# Patient Record
Sex: Female | Born: 1975 | Race: White | Hispanic: No | Marital: Married | Smoking: Never smoker
Health system: Southern US, Community
[De-identification: ages and names within clinical notes are randomized; demographics above are authoritative.]

## PROBLEM LIST (undated history)

## (undated) DIAGNOSIS — G43829 Menstrual migraine, not intractable, without status migrainosus: Secondary | ICD-10-CM

## (undated) DIAGNOSIS — O24419 Gestational diabetes mellitus in pregnancy, unspecified control: Secondary | ICD-10-CM

## (undated) DIAGNOSIS — Z87898 Personal history of other specified conditions: Secondary | ICD-10-CM

## (undated) HISTORY — DX: Personal history of other specified conditions: Z87.898

## (undated) HISTORY — PX: WISDOM TOOTH EXTRACTION: SHX21

## (undated) HISTORY — DX: Gestational diabetes mellitus in pregnancy, unspecified control: O24.419

## (undated) HISTORY — DX: Menstrual migraine, not intractable, without status migrainosus: G43.829

---

## 2001-06-15 ENCOUNTER — Other Ambulatory Visit: Admission: RE | Admit: 2001-06-15 | Discharge: 2001-06-15 | Payer: Self-pay | Admitting: Obstetrics and Gynecology

## 2002-07-16 ENCOUNTER — Other Ambulatory Visit: Admission: RE | Admit: 2002-07-16 | Discharge: 2002-07-16 | Payer: Self-pay | Admitting: Obstetrics and Gynecology

## 2003-12-29 ENCOUNTER — Inpatient Hospital Stay (HOSPITAL_COMMUNITY): Admission: AD | Admit: 2003-12-29 | Discharge: 2003-12-31 | Payer: Self-pay | Admitting: Obstetrics and Gynecology

## 2005-07-25 ENCOUNTER — Other Ambulatory Visit: Admission: RE | Admit: 2005-07-25 | Discharge: 2005-07-25 | Payer: Self-pay | Admitting: Obstetrics and Gynecology

## 2006-02-04 ENCOUNTER — Inpatient Hospital Stay (HOSPITAL_COMMUNITY): Admission: AD | Admit: 2006-02-04 | Discharge: 2006-02-06 | Payer: Self-pay | Admitting: Obstetrics and Gynecology

## 2008-01-05 ENCOUNTER — Inpatient Hospital Stay (HOSPITAL_COMMUNITY): Admission: AD | Admit: 2008-01-05 | Discharge: 2008-01-07 | Payer: Self-pay | Admitting: Obstetrics and Gynecology

## 2010-02-20 ENCOUNTER — Encounter: Admission: RE | Admit: 2010-02-20 | Discharge: 2010-02-20 | Payer: Self-pay | Admitting: Obstetrics and Gynecology

## 2010-04-24 ENCOUNTER — Inpatient Hospital Stay (HOSPITAL_COMMUNITY): Admission: RE | Admit: 2010-04-24 | Discharge: 2010-04-26 | Payer: Self-pay | Admitting: Obstetrics and Gynecology

## 2010-12-15 LAB — CBC
MCHC: 34.2 g/dL (ref 30.0–36.0)
MCHC: 34.9 g/dL (ref 30.0–36.0)
MCV: 87 fL (ref 78.0–100.0)
Platelets: 231 10*3/uL (ref 150–400)
Platelets: 246 10*3/uL (ref 150–400)
RDW: 13.6 % (ref 11.5–15.5)
RDW: 13.7 % (ref 11.5–15.5)
WBC: 10.5 10*3/uL (ref 4.0–10.5)
WBC: 10.8 10*3/uL — ABNORMAL HIGH (ref 4.0–10.5)

## 2010-12-15 LAB — GLUCOSE, RANDOM: Glucose, Bld: 105 mg/dL — ABNORMAL HIGH (ref 70–99)

## 2010-12-15 LAB — SYPHILIS: RPR W/REFLEX TO RPR TITER AND TREPONEMAL ANTIBODIES, TRADITIONAL SCREENING AND DIAGNOSIS ALGORITHM: RPR Ser Ql: NONREACTIVE

## 2011-02-15 NOTE — Discharge Summary (Signed)
Loretta Howe, Loretta Howe               ACCOUNT NO.:  [id number]   MEDICAL RECORD NO.:  [id number]          PATIENT TYPE:  INP   LOCATION:  9115                          FACILITY:  [facility]   PHYSICIAN:  Huel Cote, M.D. DATE OF BIRTH:  [birth year]   DATE OF ADMISSION:  02/04/2006  DATE OF DISCHARGE:  02/06/2006                                 DISCHARGE SUMMARY   DISCHARGE DIAGNOSES:  1.  Term pregnancy at 39 plus weeks, delivered.  2.  Status post normal spontaneous vaginal delivery.   DISCHARGE MEDICATIONS:  1.  Motrin 600 mg p.o. every 6 hours.  2.  Percocet 1-2 tablets p.o. every 4 hours p.r.n.   DISCHARGE FOLLOWUP:  The patient's follow up in 6 weeks for routine  postpartum exam.   HOSPITAL COURSE:  The patient is a 35 year old G2, P1-0-0-1 who was admitted  at 39-2/[redacted] weeks gestation for induction of labor given a favorable cervix  and term status.  Prenatal care was uneventful.  Prenatal labs were as  follows.  O+, antibody negative, RPR nonreactive, rubella immune, hepatitis  B surface antigen negative, HIV negative, GC negative, chlamydia negative,  group B strep negative, 1-hour Glucola 106.   PAST OB HISTORY:  In March 2005, the patient had a vaginal delivery of a  female infant.   PAST GYN HISTORY:  None.   PAST MEDICAL HISTORY:  None.   PAST SURGICAL HISTORY:  Wisdom tooth extraction in 1999.   She had no allergies and was on no medications.   She was afebrile with stable vital signs.  Fetal heart rate was reactive.  Cervix was 50, 2 and -2 station.  She had rupture of membranes performed.  Clear fluid noted after arrival.  She then progressed quickly to complete  dilation, pushed well, had normal spontaneous vaginal delivery of a vigorous  female infant over a small first-degree laceration.  Apgars were 8 and 9.  Weight was 7 pounds 3 ounces.  Placenta delivered spontaneously.  A small  laceration was repaired 2-0 Vicryl for hemostasis.  Placenta was dilated for  cord blood.  She then requested early discharge on postpartum day #1.  Was  doing very well with minimal pain.  Her lochia was normal and she was felt  stable for discharge home.     Huel Cote, M.D.  Electronically Signed    KR/MEDQ  D:  02/24/2006  T:  02/24/2006  Job:  [id number]

## 2011-02-15 NOTE — Discharge Summary (Signed)
Loretta Howe, Loretta Howe                           ACCOUNT NO.:  [id number]   MEDICAL RECORD NO.:  [id number]                   PATIENT TYPE:  INP   LOCATION:  9105                                 FACILITY:  [facility]   PHYSICIAN:  Malachi Pro. Ambrose Mantle, M.D.              DATE OF BIRTH:  08/17/1976   DATE OF ADMISSION:  12/29/2003  DATE OF DISCHARGE:  12/31/2003                                 DISCHARGE SUMMARY   HISTORY:  [age]-year-old white female para 0 gravida 1 with EDC of  December 30, 2003 by 9-week scan presented to labor and delivery for induction  of labor given the fact that she was at term and had a favorable cervix.  Prenatal care had been uncomplicated.  Blood group and type O positive,  negative antibody, negative RPR, rubella equivocal, hepatitis B surface  antigen negative, HIV declined, GC and Chlamydia negative, triple screen was  declined, 1-hour glucola was 92.   PAST OBSTETRIC HISTORY/GYNECOLOGIC HISTORY/MEDICAL HISTORY:  All negative.  Wisdom teeth had been extracted.  Had no known drug allergies.  Was on no  medication.   FAMILY HISTORY:  Negative.   HOSPITAL COURSE:  On admission her vital signs were normal, heart and lungs  were clear, abdomen was soft, cervic was 2 cm, 50%, vertex, at a -2.  Artificial rupture of the membranes produced clear fluid.  Patient later  received an epidural, by 12:30 p.m. had progressed to 8 cm dilatation, she  reached complete dilatation and pushed very well with a spontaneous delivery  of a vigorous female infant over a small second degree laceration by Dr.  Senaida Ores a living 8-pound 12-ounce infant, Apgars of 8 and 9 at one and  five minutes, nuchal cord x1 was reduced, placenta was delivered  spontaneously, a small second degree laceration repaired with 2-0 Vicryl,  cervix and rectum were intact, blood loss about 350 mL.  Postpartum the  patient did well and was discharged on the second postpartum day.  Initial  hemoglobin 12.1,  hematocrit 35.7, white count 14,800, platelet count  310,000.  Followup hemoglobin and hematocrit 10.4 and 30.8.  RPR was  nonreactive.   FINAL DIAGNOSIS:  Intrauterine pregnancy at term delivered vertex.   OPERATION:  Spontaneous vaginal delivery, repair of second degree perineal  laceration.   FINAL CONDITION:  Improved.   DISCHARGE INSTRUCTIONS:  Instructions include our regular discharge  instruction booklet.  Patient is offered the rubella vaccine, given a  prescription for Percocet 5/325 (20 tablets) one every 4-6 hours as needed  for pain, is advised to return to the office in 6 weeks for followup  examination.                                               Malachi Pro. Ambrose Mantle,  M.D.    TFH/MEDQ  D:  12/31/2003  T:  01/01/2004  Job:  [id number]

## 2011-02-15 NOTE — Discharge Summary (Signed)
NAMEHAYDIN, Loretta Howe               ACCOUNT NO.:  [id number]   MEDICAL RECORD NO.:  [id number]           PATIENT TYPE:   LOCATION:                                 FACILITY:   PHYSICIAN:  Huel Cote, M.D.      DATE OF BIRTH:   DATE OF ADMISSION:  01/05/2008  DATE OF DISCHARGE:  01/07/2008                               DISCHARGE SUMMARY   DISCHARGE DIAGNOSES:  1. Term pregnancy at 39 plus weeks delivered.  2. Status post normal spontaneous vaginal delivery.   DISCHARGE MEDICATIONS:  1. Motrin 600 mg p.o. every 6 hours.  2. Percocet 1-2 tablets p.o. every 4 hours p.r.n.   DISCHARGE FOLLOWUP:  The patient is to follow up in the office in 6  weeks for her routine postpartum exam.   HOSPITAL COURSE:  The patient is a 35 year old G3, P2-0-0-2, who was  admitted at 54 weeks' gestation with term status for induction of labor  given a favorable cervix.  Prenatal care was uneventful.  Prenatal labs  are as follows, O positive, antibody negative, RPR nonreactive, rubella  immune, hepatitis B surface antigen negative, HIV negative, GC negative,  chlamydia negative, group B strep negative, 1-hour Glucola 95, first  trimester screen normal.   PAST OBSTETRICAL HISTORY:  The patient had a vaginal delivery in March  2005, 8-pound 12-ounce female infant.  She had a second vaginal delivery  in May 2007 of 7-pound 3-ounce infant female.   PAST GYN HISTORY:  None.   PAST SURGICAL HISTORY:  Wisdom teeth removal only.   PAST MEDICAL HISTORY:  None.   ALLERGIES:  None.   MEDICATIONS:  Prenatal vitamins only.   Upon admission, she was 60% effaced, 2+ cm dilated, and -2 station.  Fetal heart rate was reactive.  She had rupture of membranes performed  and was on Pitocin.  She quickly reached for complete dilation by lunch  time and had an easy vaginal delivery of a vigorous female infant.  Weight was 7 pounds 8 ounces.  Apgars were 9 and 9.  Placenta  delivered spontaneously.  Cervix and  rectum were intact.  She had small  perineal abrasion, which was repaired for hemostasis.  She then did  quite well.  Postpartum hemoglobin was 10 and she on postpartum day #2  was doing quite well.  Her pain was well controlled with Motrin and  Percocet and she was felt stable for discharge home.      Huel Cote, M.D.  Electronically Signed     KR/MEDQ  D:  02/17/2008  T:  02/17/2008  Job:  [id number]

## 2011-06-25 LAB — CBC
Hemoglobin: 11.8 — ABNORMAL LOW
MCV: 85.4
Platelets: 224
RBC: 3.37 — ABNORMAL LOW
RBC: 4.01
RDW: 14.1
WBC: 13.6 — ABNORMAL HIGH

## 2011-06-25 LAB — RPR: RPR Ser Ql: NONREACTIVE

## 2011-10-01 NOTE — L&D Delivery Note (Signed)
Delivery Note At 3:39 PM a viable female was delivered via  (Presentation: LOA ).  APGAR: 8,9 ; weight pending..   Placenta status: delivered spontaneously.  Cord:  with the following complications: short.  Placenta sent to path for IUGR.  Anesthesia:epidural   Episiotomy: none Lacerations: none Suture Repair: n/a Est. Blood Loss (mL): 350cc  Mom to postpartum.  Baby to stay in room with mother.  Vigorous cry, wil observe closely.Marland Kitchen  Oliver Pila 06/02/2012, 3:59 PM

## 2011-11-18 LAB — OB RESULTS CONSOLE GC/CHLAMYDIA
Chlamydia: NEGATIVE
Gonorrhea: NEGATIVE

## 2011-11-18 LAB — OB RESULTS CONSOLE ABO/RH

## 2011-11-18 LAB — OB RESULTS CONSOLE RPR: RPR: NONREACTIVE

## 2011-11-18 LAB — OB RESULTS CONSOLE HIV ANTIBODY (ROUTINE TESTING): HIV: NONREACTIVE

## 2011-11-18 LAB — OB RESULTS CONSOLE RUBELLA ANTIBODY, IGM: Rubella: UNDETERMINED

## 2011-12-09 ENCOUNTER — Other Ambulatory Visit: Payer: Self-pay

## 2012-04-01 ENCOUNTER — Encounter: Payer: BC Managed Care – PPO | Attending: Obstetrics and Gynecology | Admitting: *Deleted

## 2012-04-01 DIAGNOSIS — Z713 Dietary counseling and surveillance: Secondary | ICD-10-CM | POA: Insufficient documentation

## 2012-04-01 DIAGNOSIS — O99810 Abnormal glucose complicating pregnancy: Secondary | ICD-10-CM | POA: Insufficient documentation

## 2012-04-02 ENCOUNTER — Encounter: Payer: Self-pay | Admitting: *Deleted

## 2012-04-02 NOTE — Patient Instructions (Signed)
Goals:  Check glucose levels per MD as instructed  Follow Gestational Diabetes Diet as instructed  Call for follow-up as needed    

## 2012-04-02 NOTE — Progress Notes (Signed)
  Patient was seen on 04/01/12 for Gestational Diabetes self-management class at the [facility]. The following learning objectives were met by the patient during this course:   States the definition of Gestational Diabetes  States why dietary management is important in controlling blood glucose  Describes the effects each nutrient has on blood glucose levels  Demonstrates ability to create a balanced meal plan  Demonstrates carbohydrate counting   States when to check blood glucose levels  Demonstrates proper blood glucose monitoring techniques  States the effect of stress and exercise on blood glucose levels  States the importance of limiting caffeine and abstaining from alcohol and smoking  Blood glucose monitor given: Accu Chek Nano BG Monitoring Kit Lot # [id number] X Exp: 1/14 Blood glucose reading: 85 md/dL  Patient instructed to monitor glucose levels: FBS: 60 - <90 1 hour: <140   *Patient received handouts:  Nutrition Diabetes and Pregnancy  Carbohydrate Counting List  Patient will be seen for follow-up as needed.

## 2012-05-18 ENCOUNTER — Other Ambulatory Visit (HOSPITAL_COMMUNITY): Payer: Self-pay | Admitting: Obstetrics and Gynecology

## 2012-05-18 DIAGNOSIS — O2690 Pregnancy related conditions, unspecified, unspecified trimester: Secondary | ICD-10-CM

## 2012-05-19 ENCOUNTER — Encounter (HOSPITAL_COMMUNITY): Payer: Self-pay

## 2012-05-19 ENCOUNTER — Other Ambulatory Visit (HOSPITAL_COMMUNITY): Payer: Self-pay | Admitting: Obstetrics and Gynecology

## 2012-05-19 ENCOUNTER — Ambulatory Visit (HOSPITAL_COMMUNITY)
Admission: RE | Admit: 2012-05-19 | Discharge: 2012-05-19 | Disposition: A | Discharge status: Home / Self Care | Payer: BC Managed Care – PPO | Source: Ambulatory Visit | Attending: Obstetrics and Gynecology | Admitting: Obstetrics and Gynecology

## 2012-05-19 VITALS — BP 117/69 | HR 82 | Wt 180.8 lb

## 2012-05-19 DIAGNOSIS — O365990 Maternal care for other known or suspected poor fetal growth, unspecified trimester, not applicable or unspecified: Secondary | ICD-10-CM | POA: Insufficient documentation

## 2012-05-19 DIAGNOSIS — O2690 Pregnancy related conditions, unspecified, unspecified trimester: Secondary | ICD-10-CM

## 2012-05-19 DIAGNOSIS — O99810 Abnormal glucose complicating pregnancy: Secondary | ICD-10-CM | POA: Insufficient documentation

## 2012-05-19 DIAGNOSIS — IMO0002 Reserved for concepts with insufficient information to code with codable children: Secondary | ICD-10-CM

## 2012-05-19 NOTE — Progress Notes (Signed)
Loretta Howe  was seen today for an ultrasound appointment.  See full report in [facility].  Alpha Gula, MD  Single IUP at 35 6/7 weeks Estimated fetal weight today is at the 15th %tile.  [facility] measures < 3rd %tile Elevated umbilical artery Doppler studies (93rd %tile) without evidence of absent or reversed diastolic flow Normal amniotic fluid volume  Recommend 2x weekly antepartum fetal testing - weekly BPP/ Doppler studies with MFM and weekly NSTs at referring office Recommend induction of labor at 37-[redacted] weeks gestation.

## 2012-05-26 ENCOUNTER — Ambulatory Visit (HOSPITAL_COMMUNITY)
Admission: RE | Admit: 2012-05-26 | Discharge: 2012-05-26 | Disposition: A | Discharge status: Home / Self Care | Payer: BC Managed Care – PPO | Source: Ambulatory Visit | Attending: Obstetrics and Gynecology | Admitting: Obstetrics and Gynecology

## 2012-05-26 ENCOUNTER — Encounter (HOSPITAL_COMMUNITY): Payer: Self-pay | Admitting: *Deleted

## 2012-05-26 ENCOUNTER — Telehealth (HOSPITAL_COMMUNITY): Payer: Self-pay | Admitting: *Deleted

## 2012-05-26 DIAGNOSIS — O99810 Abnormal glucose complicating pregnancy: Secondary | ICD-10-CM | POA: Insufficient documentation

## 2012-05-26 DIAGNOSIS — IMO0002 Reserved for concepts with insufficient information to code with codable children: Secondary | ICD-10-CM

## 2012-05-26 DIAGNOSIS — O365990 Maternal care for other known or suspected poor fetal growth, unspecified trimester, not applicable or unspecified: Secondary | ICD-10-CM | POA: Insufficient documentation

## 2012-05-26 LAB — OB RESULTS CONSOLE GBS: GBS: NEGATIVE

## 2012-05-26 IMAGING — US US FETAL BPP W/O NONSTRESS
1 series · 12 of 28 positions shown · non-contrast
Comparison: none

[Series 1: us fetal bpp w/o nonstress · 12 of 29 slices shown]
[im 2/29]
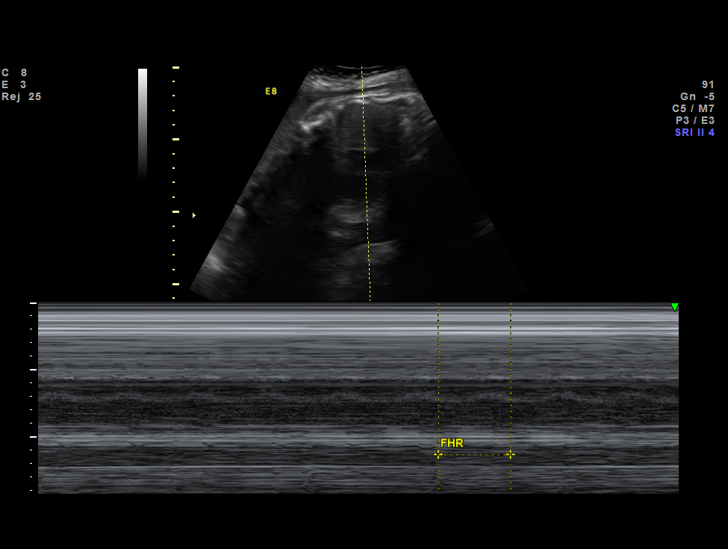
[im 4/29]
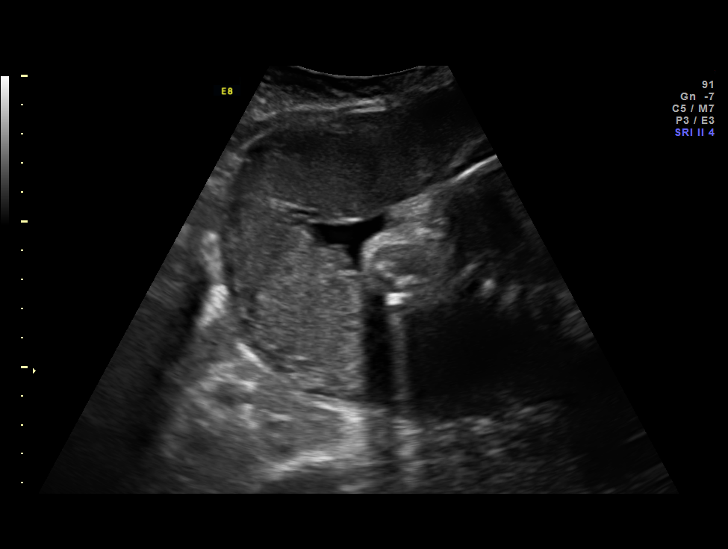
[im 6/29]
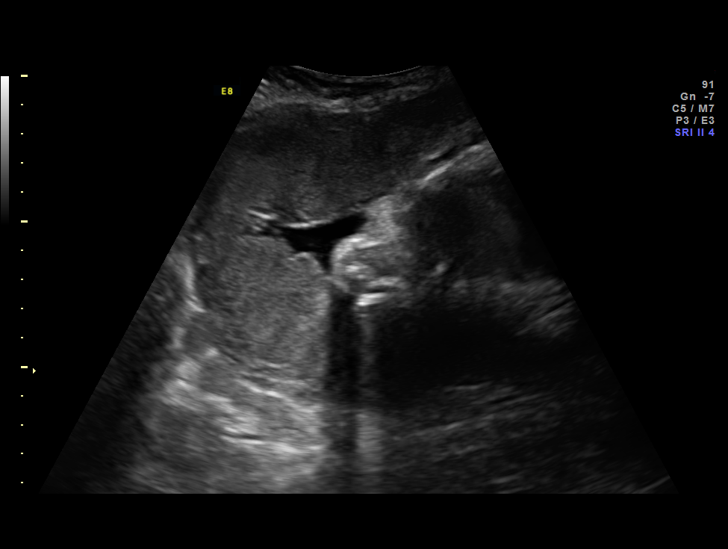
[im 9/29]
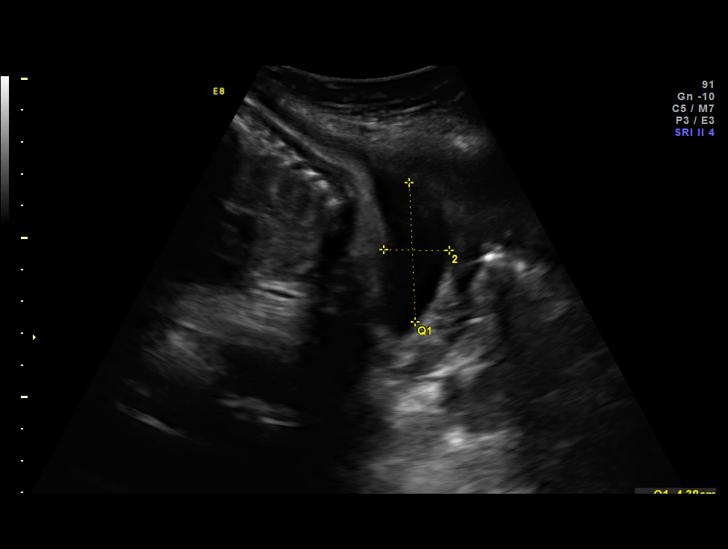
[im 11/29]
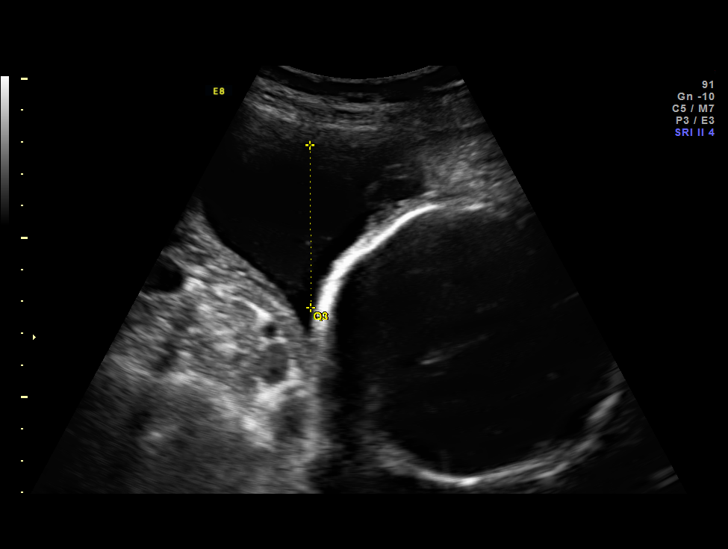
[im 13/29]
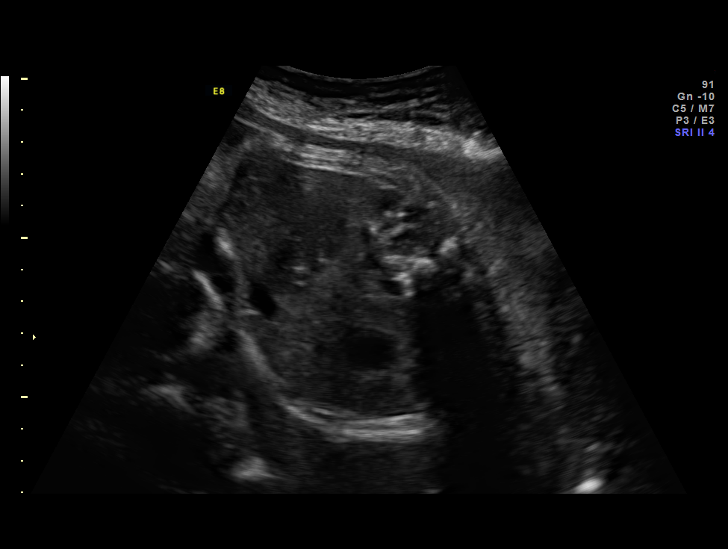
[im 16/29]
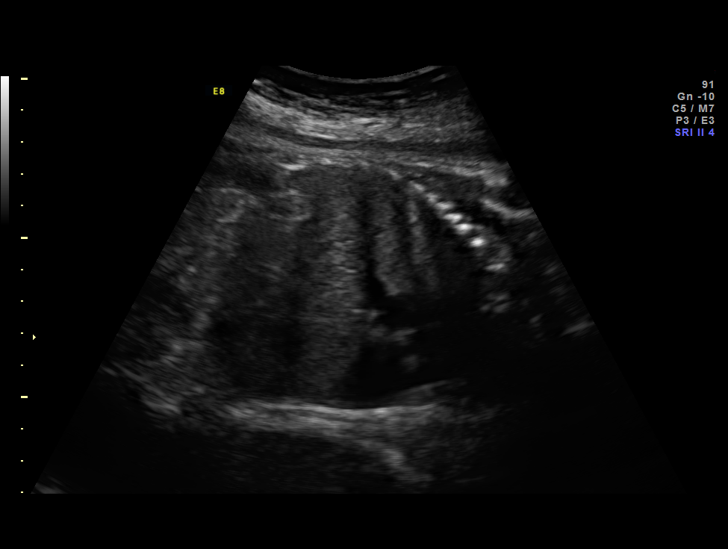
[im 18/29]
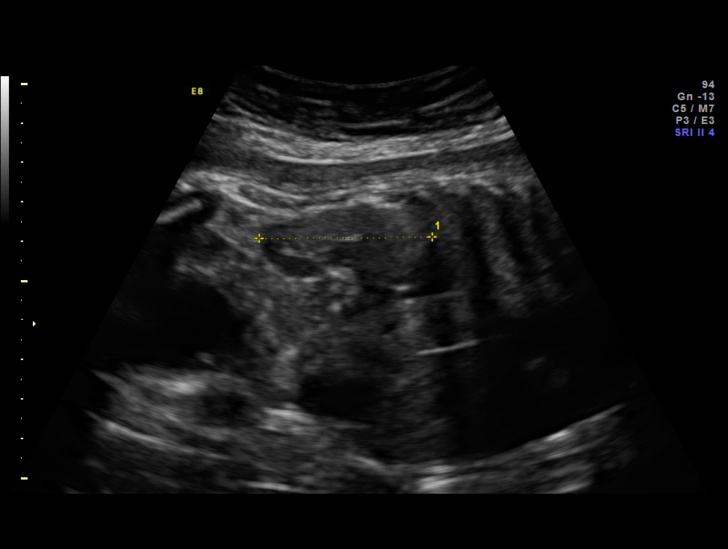
[im 20/29]
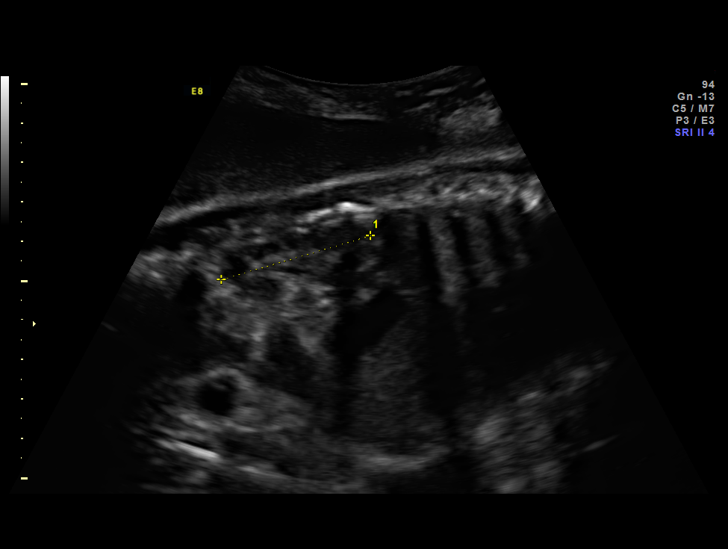
[im 23/29]
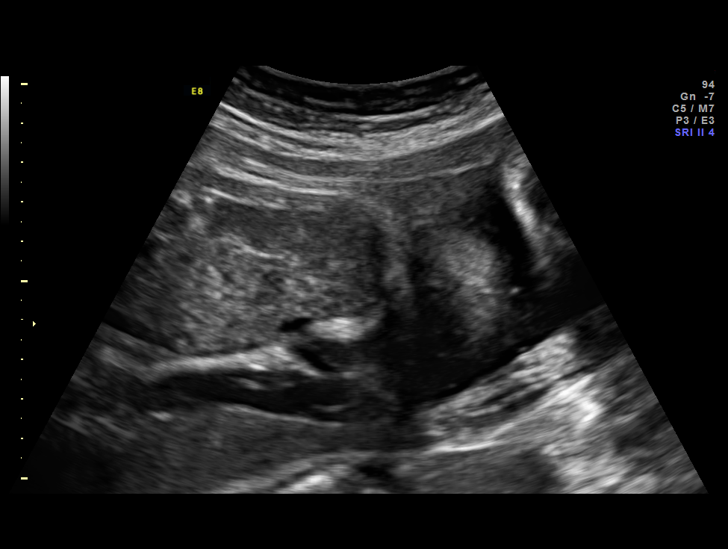
[im 25/29]
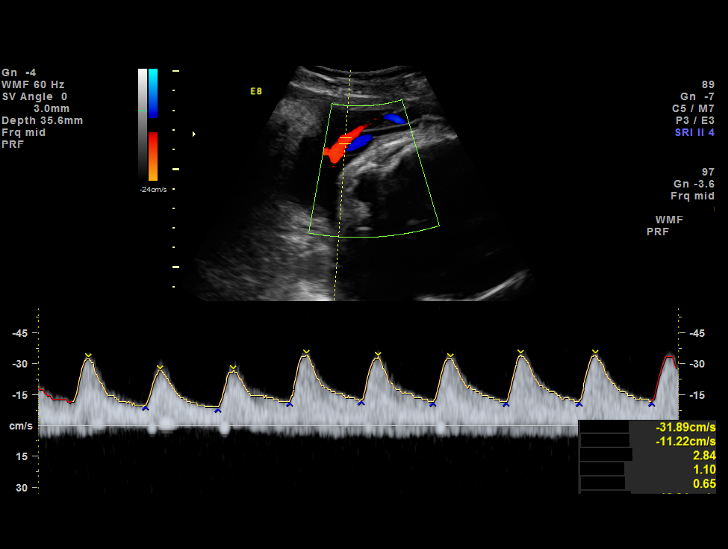
[im 27/29]
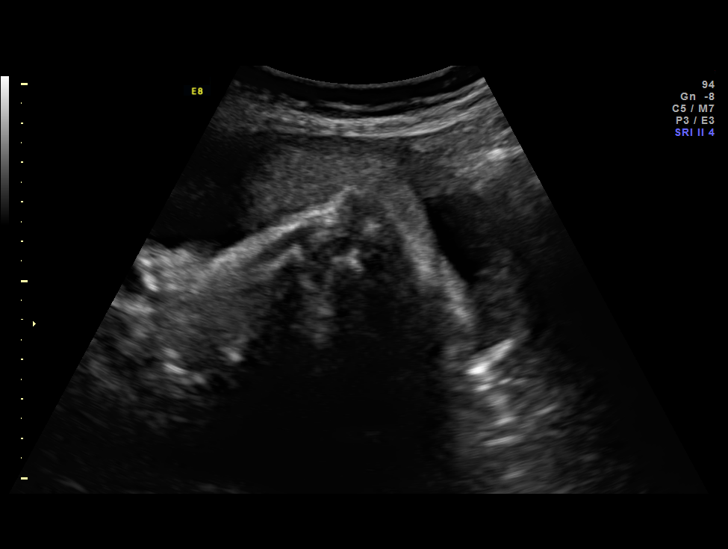

[12 of 28 positions shown; findings below may reference images not displayed]

OBSTETRICS REPORT
                      (Signed Final [DATE] [DATE])

 Order#:         [PHONE_NUMBER]_O,[3P]
                 [id number]
Procedures

 US UA CORD DOPPLER                                    76827.2
Indications

 Size less than dates (Small for gestational [AGE]
 FGR)
 Diabetes - Gestational
 Assess fetal well being
Fetal Evaluation

 Fetal Heart Rate:  139                          bpm
 Cardiac Activity:  Observed
 Presentation:      Cephalic
 Placenta:          Fundal, above cervical os
 P. Cord            Previously Visualized
 Insertion:

 Amniotic Fluid
 AFI FV:      Subjectively within normal limits
 AFI Sum:     14.49   cm       54  %Tile     Larg Pckt:     5.1  cm
 RUQ:   4.38    cm   RLQ:    2.88   cm    LUQ:   5.1     cm   LLQ:    2.13   cm
Biophysical Evaluation

 Amniotic F.V:   Within normal limits       F. Tone:        Observed
 F. Movement:    Observed                   Score:          [DATE]
 F. Breathing:   Not Observed
Gestational Age

 LMP:           36w 6d        Date:  [DATE]                 EDD:   [DATE]
 Best:          36w 6d     Det. By:  LMP  ([DATE])          EDD:   [DATE]
Anatomy

 Cranium:           Previously seen     Aortic Arch:       Previously seen
 Fetal Cavum:       Previously seen     Ductal Arch:       Not well
                                                           visualized
 Ventricles:        Previously seen     Diaphragm:         Previously seen
 Choroid Plexus:    Previously seen     Stomach:           Previously Seen
 Cerebellum:        Previously seen     Abdomen:           Previously seen
 Posterior Fossa:   Previously seen     Abdominal Wall:    Not well
                                                           visualized
 Nuchal Fold:       Not applicable      Cord Vessels:      Previously seen
                    (>20 wks GA)
 Face:              Lips previously     Kidneys:           Previously seen
                    seen
 Heart:             Previously seen     Bladder:           Previously seen
 RVOT:              Not well            Spine:             Previously seen
                    visualized
 LVOT:              Not well            Limbs:             Previously seen
                    visualized
Doppler - Fetal Vessels

 Umbilical Artery
 S/D:   3.03           84  %tile       RI:
 PI:    1.16                           PSV:       -       cm/s


Cervix Uterus Adnexa

 Cervix:       Not visualized (advanced GA >34 wks)
Impression

 Single IUP at 36 [DATE] weeks
 Estimated fetal weight at the 15th %tile and AC measures <
 3rd %tile on ultrasound [DATE]
 BPP today [DATE] (-2 for lack of sustained breathing movement)
 Normal amniotic fluid volume
 Normal umbilical artery Doppler studies for gestational age
Recommendations

 Recommend 2x weekly antepartum fetal testing - weekly
 BPP/ Doppler studies with MFM and weekly NSTs at referring
 office.
 Tentatively scheduled for induction of labor next week

 questions or concerns.

## 2012-05-26 NOTE — Progress Notes (Signed)
Loretta Howe  was seen today for an ultrasound appointment.  See full report in [facility].  Alpha Gula, MD  Single IUP at 36 6/7 weeks Estimated fetal weight at the 15th %tile and AC measures < 3rd %tile on ultrasound 8/20 BPP today 8/10 (-2 for lack of sustained breathing movement) Normal amniotic fluid volume Normal umbilical artery Doppler studies for gestational age  Recommend 2x weekly antepartum fetal testing - weekly BPP/ Doppler studies with MFM and weekly NSTs at referring office.  Tentatively scheduled for induction of labor next week

## 2012-05-26 NOTE — Telephone Encounter (Signed)
Preadmission screen  

## 2012-06-01 NOTE — H&P (Signed)
Loretta Howe is a 36 y.o. female G5P4004 at 37+ weeks (EDD 06/21/12 by 6 week [place])  presenting for IOL given a finding of suspected IUGR on [place] at 35 weeks in the office with EFW <10%ile.  Pt seen by MFM for weekly dopplers which were intermittently elevated to 97%ile and MFM advised delivery at 37-38 weeks.  NST's remained reassuring.Prenatal care otherwise complicated by GDM which was well-controlled by diet.  No other significant issues.  Maternal Medical History:  Prenatal complications: IUGR.   Prenatal Complications - Diabetes: gestational. Diabetes is managed by diet.      OB History    Grav Para Term Preterm Abortions TAB SAB Ect Mult Living   5 4 4       4     NSVD 2005 8#12oz NSVD 7#3oz 2007 NSVD 2009 7#8oz NSVD 2011 7#8oz  Past Medical History  Diagnosis Date  . Diabetes mellitus   . History of mastitis   . Gestational diabetes     diet controlled   Past Surgical History  Procedure Date  . Wisdom tooth extraction    Family History: family history includes Alcohol abuse in her father; Cancer in her father and maternal grandmother; and Depression in her father and mother. Social History:  reports that she has never smoked. She has never used smokeless tobacco. She reports that she does not drink alcohol or use illicit drugs.   Prenatal Transfer Tool  Maternal Diabetes: Yes:  Diabetes Type:  Diet controlled Genetic Screening: Normal Maternal Ultrasounds/Referrals: Abnormal:  Findings:   IUGR Fetal Ultrasounds or other Referrals:  Referred to Materal Fetal Medicine  Maternal Substance Abuse:  No Significant Maternal Medications:  None Significant Maternal Lab Results:  None Other Comments:  None  ROS    Last menstrual period 09/11/2011. Maternal Exam:  Uterine Assessment: Contraction strength is mild.  Contraction frequency is irregular.   Abdomen: Patient reports no abdominal tenderness. Fetal presentation: vertex  Introitus: Normal vulva. Normal vagina.     Physical Exam  Constitutional: She is oriented to person, place, and time. She appears well-developed and well-nourished.  Cardiovascular: Normal rate and regular rhythm.   Respiratory: Effort normal and breath sounds normal.  GI: Soft. Bowel sounds are normal.  Genitourinary: Vagina normal and uterus normal.  Musculoskeletal: Normal range of motion.  Neurological: She is alert and oriented to person, place, and time.  Psychiatric: She has a normal mood and affect. Her behavior is normal.    Prenatal labs: ABO, Rh: O/Positive/-- (02/18 0000) Antibody: Negative (02/18 0000) Rubella: Equivocal (02/18 0000) RPR: Nonreactive (02/18 0000)  HBsAg: Negative (02/18 0000)  HIV: Non-reactive (02/18 0000)  GBS: Negative (08/27 0000)  Early One hour normal 116, 28 week GCT 164 First trimester screen and AFP WNL Assessment/Plan: Pt for IOL at 37+ weeks as per MFM for IUGR <10%ile at 35 weeks on office [place] and intermittently elevated dopplers.  Plan pitocin and ROM.   Oliver Pila 06/01/2012, 8:01 PM

## 2012-06-02 ENCOUNTER — Ambulatory Visit (HOSPITAL_COMMUNITY): Payer: BC Managed Care – PPO

## 2012-06-02 ENCOUNTER — Inpatient Hospital Stay (HOSPITAL_COMMUNITY): Payer: BC Managed Care – PPO | Admitting: Anesthesiology

## 2012-06-02 ENCOUNTER — Encounter (HOSPITAL_COMMUNITY): Payer: Self-pay

## 2012-06-02 ENCOUNTER — Encounter (HOSPITAL_COMMUNITY): Payer: Self-pay | Admitting: Anesthesiology

## 2012-06-02 ENCOUNTER — Inpatient Hospital Stay (HOSPITAL_COMMUNITY)
Admission: RE | Admit: 2012-06-02 | Discharge: 2012-06-04 | DRG: 372 | Disposition: A | Discharge status: Home / Self Care | Payer: BC Managed Care – PPO | Source: Ambulatory Visit | Attending: Obstetrics and Gynecology | Admitting: Obstetrics and Gynecology

## 2012-06-02 ENCOUNTER — Inpatient Hospital Stay (HOSPITAL_COMMUNITY)
Admission: AD | Admit: 2012-06-02 | Payer: BC Managed Care – PPO | Source: Ambulatory Visit | Admitting: Obstetrics and Gynecology

## 2012-06-02 DIAGNOSIS — O99814 Abnormal glucose complicating childbirth: Secondary | ICD-10-CM | POA: Diagnosis present

## 2012-06-02 DIAGNOSIS — O365990 Maternal care for other known or suspected poor fetal growth, unspecified trimester, not applicable or unspecified: Principal | ICD-10-CM | POA: Diagnosis present

## 2012-06-02 LAB — CBC
Platelets: 252 10*3/uL (ref 150–400)
RBC: 4.13 MIL/uL (ref 3.87–5.11)
RDW: 13.3 % (ref 11.5–15.5)
WBC: 9.9 10*3/uL (ref 4.0–10.5)

## 2012-06-02 LAB — GLUCOSE, RANDOM: Glucose, Bld: 85 mg/dL (ref 70–99)

## 2012-06-02 LAB — RPR: RPR Ser Ql: NONREACTIVE

## 2012-06-02 MED ORDER — LIDOCAINE HCL (PF) 1 % IJ SOLN
INTRAMUSCULAR | Status: DC | PRN
  Administered 2012-06-02 (×2): 9 mL

## 2012-06-02 MED ORDER — ACETAMINOPHEN 325 MG PO TABS: 650.0000 mg | ORAL_TABLET | ORAL | Status: DC | PRN

## 2012-06-02 MED ORDER — LACTATED RINGERS IV SOLN
INTRAVENOUS | Status: DC
  Administered 2012-06-02: 950 mL via INTRAVENOUS

## 2012-06-02 MED ORDER — EPHEDRINE 5 MG/ML INJ
10.0000 mg | INTRAVENOUS | Status: DC | PRN
  Administered 2012-06-02: 10 mg via INTRAVENOUS

## 2012-06-02 MED ORDER — TETANUS-DIPHTH-ACELL PERTUSSIS 5-2.5-18.5 LF-MCG/0.5 IM SUSP: 0.5000 mL | Freq: Once | INTRAMUSCULAR | Status: DC

## 2012-06-02 MED ORDER — WITCH HAZEL-GLYCERIN EX PADS: 1.0000 "application " | MEDICATED_PAD | CUTANEOUS | Status: DC | PRN

## 2012-06-02 MED ORDER — CITRIC ACID-SODIUM CITRATE 334-500 MG/5ML PO SOLN: 30.0000 mL | ORAL | Status: DC | PRN

## 2012-06-02 MED ORDER — OXYTOCIN BOLUS FROM INFUSION
250.0000 mL | Freq: Once | INTRAVENOUS | Status: DC
  Filled 2012-06-02: qty 500

## 2012-06-02 MED ORDER — FENTANYL 2.5 MCG/ML BUPIVACAINE 1/10 % EPIDURAL INFUSION (WH - ANES)
14.0000 mL/h | INTRAMUSCULAR | Status: DC
  Administered 2012-06-02 (×2): 14 mL/h via EPIDURAL
  Filled 2012-06-02 (×2): qty 60

## 2012-06-02 MED ORDER — FLEET ENEMA 7-19 GM/118ML RE ENEM: 1.0000 | ENEMA | RECTAL | Status: DC | PRN

## 2012-06-02 MED ORDER — PHENYLEPHRINE 40 MCG/ML (10ML) SYRINGE FOR IV PUSH (FOR BLOOD PRESSURE SUPPORT): 80.0000 ug | PREFILLED_SYRINGE | INTRAVENOUS | Status: DC | PRN

## 2012-06-02 MED ORDER — OXYCODONE-ACETAMINOPHEN 5-325 MG PO TABS: 1.0000 | ORAL_TABLET | ORAL | Status: DC | PRN

## 2012-06-02 MED ORDER — ONDANSETRON HCL 4 MG/2ML IJ SOLN
4.0000 mg | Freq: Four times a day (QID) | INTRAMUSCULAR | Status: DC | PRN
  Filled 2012-06-02: qty 2

## 2012-06-02 MED ORDER — LACTATED RINGERS IV SOLN: 500.0000 mL | Freq: Once | INTRAVENOUS | Status: DC

## 2012-06-02 MED ORDER — ZOLPIDEM TARTRATE 5 MG PO TABS: 5.0000 mg | ORAL_TABLET | Freq: Every evening | ORAL | Status: DC | PRN

## 2012-06-02 MED ORDER — DIPHENHYDRAMINE HCL 50 MG/ML IJ SOLN: 12.5000 mg | INTRAMUSCULAR | Status: DC | PRN

## 2012-06-02 MED ORDER — LANOLIN HYDROUS EX OINT: TOPICAL_OINTMENT | CUTANEOUS | Status: DC | PRN

## 2012-06-02 MED ORDER — FENTANYL 2.5 MCG/ML BUPIVACAINE 1/10 % EPIDURAL INFUSION (WH - ANES)
INTRAMUSCULAR | Status: DC | PRN
  Administered 2012-06-02: 14 mL/h via EPIDURAL

## 2012-06-02 MED ORDER — SENNOSIDES-DOCUSATE SODIUM 8.6-50 MG PO TABS
2.0000 | ORAL_TABLET | Freq: Every day | ORAL | Status: DC
  Administered 2012-06-02 – 2012-06-03 (×2): 2 via ORAL

## 2012-06-02 MED ORDER — PHENYLEPHRINE 40 MCG/ML (10ML) SYRINGE FOR IV PUSH (FOR BLOOD PRESSURE SUPPORT)
80.0000 ug | PREFILLED_SYRINGE | INTRAVENOUS | Status: DC | PRN
  Filled 2012-06-02: qty 5

## 2012-06-02 MED ORDER — BENZOCAINE-MENTHOL 20-0.5 % EX AERO: 1.0000 "application " | INHALATION_SPRAY | CUTANEOUS | Status: DC | PRN

## 2012-06-02 MED ORDER — DIPHENHYDRAMINE HCL 25 MG PO CAPS: 25.0000 mg | ORAL_CAPSULE | Freq: Four times a day (QID) | ORAL | Status: DC | PRN

## 2012-06-02 MED ORDER — DIBUCAINE 1 % RE OINT: 1.0000 "application " | TOPICAL_OINTMENT | RECTAL | Status: DC | PRN

## 2012-06-02 MED ORDER — IBUPROFEN 600 MG PO TABS
600.0000 mg | ORAL_TABLET | Freq: Four times a day (QID) | ORAL | Status: DC
  Administered 2012-06-02 – 2012-06-04 (×8): 600 mg via ORAL
  Filled 2012-06-02 (×7): qty 1

## 2012-06-02 MED ORDER — EPHEDRINE 5 MG/ML INJ
10.0000 mg | INTRAVENOUS | Status: DC | PRN
  Filled 2012-06-02 (×2): qty 4

## 2012-06-02 MED ORDER — TERBUTALINE SULFATE 1 MG/ML IJ SOLN: 0.2500 mg | Freq: Once | INTRAMUSCULAR | Status: DC | PRN

## 2012-06-02 MED ORDER — SIMETHICONE 80 MG PO CHEW: 80.0000 mg | CHEWABLE_TABLET | ORAL | Status: DC | PRN

## 2012-06-02 MED ORDER — LACTATED RINGERS IV SOLN
500.0000 mL | INTRAVENOUS | Status: DC | PRN
  Administered 2012-06-02: 1000 mL via INTRAVENOUS

## 2012-06-02 MED ORDER — ONDANSETRON HCL 4 MG PO TABS: 4.0000 mg | ORAL_TABLET | ORAL | Status: DC | PRN

## 2012-06-02 MED ORDER — PRENATAL MULTIVITAMIN CH
1.0000 | ORAL_TABLET | Freq: Every day | ORAL | Status: DC
  Administered 2012-06-03 – 2012-06-04 (×2): 1 via ORAL
  Filled 2012-06-02 (×2): qty 1

## 2012-06-02 MED ORDER — ONDANSETRON HCL 4 MG/2ML IJ SOLN: 4.0000 mg | INTRAMUSCULAR | Status: DC | PRN

## 2012-06-02 MED ORDER — OXYTOCIN 40 UNITS IN LACTATED RINGERS INFUSION - SIMPLE MED
1.0000 m[IU]/min | INTRAVENOUS | Status: DC
  Administered 2012-06-02: 6 m[IU]/min via INTRAVENOUS
  Administered 2012-06-02: 2 m[IU]/min via INTRAVENOUS
  Administered 2012-06-02: 4 m[IU]/min via INTRAVENOUS
  Administered 2012-06-02: 10 m[IU]/min via INTRAVENOUS
  Filled 2012-06-02: qty 1000

## 2012-06-02 MED ORDER — OXYTOCIN 40 UNITS IN LACTATED RINGERS INFUSION - SIMPLE MED
62.5000 mL/h | Freq: Once | INTRAVENOUS | Status: AC
  Administered 2012-06-02: 200 mL/h via INTRAVENOUS

## 2012-06-02 MED ORDER — IBUPROFEN 600 MG PO TABS: 600.0000 mg | ORAL_TABLET | Freq: Four times a day (QID) | ORAL | Status: DC | PRN

## 2012-06-02 MED ORDER — LIDOCAINE HCL (PF) 1 % IJ SOLN: 30.0000 mL | INTRAMUSCULAR | Status: DC | PRN

## 2012-06-02 NOTE — Progress Notes (Signed)
Adriena D Menor is a 36 y.o. G5P4004 at [redacted]w[redacted]d for IOL for suspected IUGR and intermittently elevated dopplers Subjective: Feeling mild ctx on pitocin  Objective: BP 114/65  Pulse 70  Temp 98.3 F (36.8 C) (Oral)  Resp 20  Ht 6' (1.829 m)  Wt 82.101 kg (181 lb)  BMI 24.55 kg/m2  LMP 09/11/2011      FHT:  FHR: 135 bpm, variability: moderate,  accelerations:  Present,  decelerations:  Absent UC:   regular, every 3-4 minutes SVE:   Dilation: 1.5 Effacement (%): 50 Station: -2 Exam by:: Dr Senaida Ores AROM, scant clear  Labs: Lab Results  Component Value Date   WBC 9.9 06/02/2012   HGB 11.6* 06/02/2012   HCT 35.3* 06/02/2012   MCV 85.5 06/02/2012   PLT 252 06/02/2012    Assessment / Plan: On pitocin, expect vaginal delivery  Leoda Smithhart W 06/02/2012, 9:26 AM

## 2012-06-02 NOTE — Anesthesia Procedure Notes (Signed)
Epidural Patient location during procedure: OB Start time: 06/02/2012 11:55 AM End time: 06/02/2012 12:00 PM  Staffing Anesthesiologist: Sandrea Hughs Performed by: anesthesiologist   Preanesthetic Checklist Completed: patient identified, site marked, surgical consent, pre-op evaluation, timeout performed, IV checked, risks and benefits discussed and monitors and equipment checked  Epidural Patient position: sitting Prep: site prepped and draped and DuraPrep Patient monitoring: continuous pulse ox and blood pressure Approach: midline Injection technique: LOR air  Needle:  Needle type: Tuohy  Needle gauge: 17 G Needle length: 9 cm and 9 Needle insertion depth: 5 cm cm Catheter type: closed end flexible Catheter size: 19 Gauge Catheter at skin depth: 10 cm Test dose: negative and Other  Assessment Sensory level: T8 Events: blood not aspirated, injection not painful, no injection resistance, negative IV test and no paresthesia

## 2012-06-02 NOTE — Anesthesia Preprocedure Evaluation (Signed)
Anesthesia Evaluation  Patient identified by MRN, date of birth, ID band Patient awake    Reviewed: Allergy & Precautions, H&P , NPO status , Patient's Chart, lab work & pertinent test results  Airway Mallampati: I TM Distance: >3 FB Neck ROM: full    Dental No notable dental hx.    Pulmonary neg pulmonary ROS,    Pulmonary exam normal       Cardiovascular negative cardio ROS      Neuro/Psych negative neurological ROS  negative psych ROS   GI/Hepatic negative GI ROS, Neg liver ROS,   Endo/Other    Renal/GU negative Renal ROS  negative genitourinary   Musculoskeletal negative musculoskeletal ROS (+)   Abdominal Normal abdominal exam  (+)   Peds negative pediatric ROS (+)  Hematology negative hematology ROS (+)   Anesthesia Other Findings   Reproductive/Obstetrics (+) Pregnancy                           Anesthesia Physical Anesthesia Plan  ASA: II  Anesthesia Plan: Epidural   Post-op Pain Management:    Induction:   Airway Management Planned:   Additional Equipment:   Intra-op Plan:   Post-operative Plan:   Informed Consent: I have reviewed the patients History and Physical, chart, labs and discussed the procedure including the risks, benefits and alternatives for the proposed anesthesia with the patient or authorized representative who has indicated his/her understanding and acceptance.     Plan Discussed with:   Anesthesia Plan Comments:         Anesthesia Quick Evaluation

## 2012-06-02 NOTE — Progress Notes (Signed)
   Subjective: Pt comfortable with epidural  Objective: BP 99/63  Pulse 70  Temp 98.4 F (36.9 C) (Oral)  Resp 18  Ht 6' (1.829 m)  Wt 82.101 kg (181 lb)  BMI 24.55 kg/m2  SpO2 100%  LMP 09/11/2011      FHT:  FHR: 130 bpm, variability: moderate,  accelerations:  Present,  decelerations:  Absent UC:   regular, every 4-5 minutes SVE:   Dilation: 3 Effacement (%): 80 Station: -2 Exam by:: Dr Senaida Ores  Labs: Lab Results  Component Value Date   WBC 9.9 06/02/2012   HGB 11.6* 06/02/2012   HCT 35.3* 06/02/2012   MCV 85.5 06/02/2012   PLT 252 06/02/2012    Assessment / Plan: IUPC placed to adjust pitocin, will follow progress.  Baby tolerating labor fine thus far. Loretta Howe 06/02/2012, 1:26 PM

## 2012-06-03 LAB — CBC
MCH: 28.7 pg (ref 26.0–34.0)
MCHC: 33.3 g/dL (ref 30.0–36.0)
Platelets: 227 10*3/uL (ref 150–400)
RBC: 3.55 MIL/uL — ABNORMAL LOW (ref 3.87–5.11)

## 2012-06-03 NOTE — Anesthesia Postprocedure Evaluation (Signed)
Anesthesia Post Note  Patient: [name]  Procedure(s) Performed: * No procedures listed *  Anesthesia type: Epidural  Patient location: Mother/Baby  Post pain: Pain level controlled  Post assessment: Post-op Vital signs reviewed  Last Vitals:  Filed Vitals:   06/03/12 0850  BP: 107/68  Pulse: 76  Temp: 36.6 C  Resp: 18    Post vital signs: Reviewed  Level of consciousness:alert  Complications: No apparent anesthesia complications

## 2012-06-03 NOTE — Addendum Note (Signed)
Addendum  created 06/03/12 1051 by Cystal Shannahan A Kathrene Sinopoli, CRNA   Modules edited:Notes Section    

## 2012-06-03 NOTE — Addendum Note (Signed)
Addendum  created 06/03/12 1051 by Algis Greenhouse, CRNA   Modules edited:Notes Section

## 2012-06-03 NOTE — Progress Notes (Signed)
Post Partum Day 1 Subjective: no complaints, up ad lib and tolerating PO  Objective: Blood pressure 107/68, pulse 76, temperature 97.9 F (36.6 C), temperature source Oral, resp. rate 18, height 6' (1.829 m), weight 82.101 kg (181 lb), last menstrual period 09/11/2011, SpO2 97.00%, unknown if currently breastfeeding.  Physical Exam:  General: alert and cooperative Lochia: appropriate Uterine Fundus: firm    Basename 06/03/12 0515 06/02/12 0720  HGB 10.2* 11.6*  HCT 30.6* 35.3*    Assessment/Plan: Plan for discharge tomorrow Baby ding well.   LOS: 1 day   Abrey Bradway W 06/03/2012, 9:15 AM

## 2012-06-04 MED ORDER — MEASLES, MUMPS & RUBELLA VAC ~~LOC~~ INJ
0.5000 mL | INJECTION | Freq: Once | SUBCUTANEOUS | Status: AC
  Administered 2012-06-04: 0.5 mL via SUBCUTANEOUS
  Filled 2012-06-04: qty 0.5

## 2012-06-04 MED ORDER — IBUPROFEN 600 MG PO TABS: 600.0000 mg | ORAL_TABLET | Freq: Four times a day (QID) | ORAL | Status: AC

## 2012-06-04 NOTE — Discharge Summary (Signed)
Obstetric Discharge Summary Reason for Admission: induction of labor Prenatal Procedures: NST and ultrasound Intrapartum Procedures: spontaneous vaginal delivery Postpartum Procedures: none Complications-Operative and Postpartum: none Hemoglobin  Date Value Range Status  06/03/2012 10.2* 12.0 - 15.0 g/dL Final     HCT  Date Value Range Status  06/03/2012 30.6* 36.0 - 46.0 % Final    Physical Exam:  General: alert and cooperative Lochia: appropriate Uterine Fundus: firm   Discharge Diagnoses: Term Pregnancy-delivered                                         IUGR Discharge Information: Date: 06/04/2012 Activity: pelvic rest Diet: routine Medications: Ibuprofen Condition: improved Instructions: refer to practice specific booklet Discharge to: home   Newborn Data: Live born female  Birth Weight: 5 lb 4 oz (2381 g) APGAR: 8, 9  Home with mother.  Oliver Pila 06/04/2012, 8:45 AM

## 2012-06-04 NOTE — Progress Notes (Signed)
Post Partum Day 2 Subjective: no complaints and tolerating PO  Objective: Blood pressure 97/60, pulse 56, temperature 97.4 F (36.3 C), temperature source Oral, resp. rate 18, height 6' (1.829 m), weight 82.101 kg (181 lb), last menstrual period 09/11/2011, SpO2 97.00%, unknown if currently breastfeeding.  Physical Exam:  General: alert and cooperative Lochia: appropriate Uterine Fundus: firm   Basename 06/03/12 0515 06/02/12 0720  HGB 10.2* 11.6*  HCT 30.6* 35.3*    Assessment/Plan: Discharge home Motrin   LOS: 2 days   Loretta Howe W 06/04/2012, 8:43 AM

## 2014-04-14 LAB — OB RESULTS CONSOLE RUBELLA ANTIBODY, IGM: Rubella: NON-IMMUNE/NOT IMMUNE

## 2014-04-14 LAB — OB RESULTS CONSOLE ANTIBODY SCREEN: Antibody Screen: NEGATIVE

## 2014-04-14 LAB — OB RESULTS CONSOLE GC/CHLAMYDIA
Chlamydia: NEGATIVE
Gonorrhea: NEGATIVE

## 2014-04-14 LAB — OB RESULTS CONSOLE HEPATITIS B SURFACE ANTIGEN: HEP B S AG: NEGATIVE

## 2014-04-14 LAB — OB RESULTS CONSOLE HIV ANTIBODY (ROUTINE TESTING): HIV: NONREACTIVE

## 2014-04-14 LAB — OB RESULTS CONSOLE RPR: RPR: NONREACTIVE

## 2014-04-14 LAB — OB RESULTS CONSOLE ABO/RH: RH Type: POSITIVE

## 2014-08-01 ENCOUNTER — Encounter (HOSPITAL_COMMUNITY): Payer: Self-pay

## 2014-09-30 NOTE — L&D Delivery Note (Signed)
Delivery Note Pt received an epidural and progressed quickly to complete dilation.  At 3:30 AM a healthy female was delivered via Vaginal, Spontaneous Delivery (Presentation: Left Occiput Transverse).  APGAR 8,9 ; weight pending .   Placenta status: Intact, Spontaneous.  Cord: 3 vessels with the following complications:none.   Anesthesia: Epidural  Episiotomy:  none Lacerations:  First degree Suture Repair: 3.0 vicryl rapide Est. Blood Loss (mL):  300cc  Mom to postpartum.  Baby to Couplet care / Skin to Skin.   Logan Bores 11/04/2014, 3:51 AM

## 2014-10-14 LAB — OB RESULTS CONSOLE GBS: STREP GROUP B AG: POSITIVE

## 2014-11-03 ENCOUNTER — Inpatient Hospital Stay (HOSPITAL_COMMUNITY)
Admission: AD | Admit: 2014-11-03 | Discharge: 2014-11-05 | DRG: 775 | Disposition: A | Discharge status: Home / Self Care | Payer: 59 | Source: Ambulatory Visit | Attending: Obstetrics and Gynecology | Admitting: Obstetrics and Gynecology

## 2014-11-03 ENCOUNTER — Encounter (HOSPITAL_COMMUNITY): Payer: Self-pay | Admitting: *Deleted

## 2014-11-03 ENCOUNTER — Telehealth (HOSPITAL_COMMUNITY): Payer: Self-pay | Admitting: *Deleted

## 2014-11-03 ENCOUNTER — Encounter (HOSPITAL_COMMUNITY): Payer: Self-pay | Admitting: Obstetrics

## 2014-11-03 DIAGNOSIS — Z8632 Personal history of gestational diabetes: Secondary | ICD-10-CM

## 2014-11-03 DIAGNOSIS — Z3A39 39 weeks gestation of pregnancy: Secondary | ICD-10-CM | POA: Diagnosis present

## 2014-11-03 LAB — CBC
HEMATOCRIT: 33.1 % — AB (ref 36.0–46.0)
Hemoglobin: 11.3 g/dL — ABNORMAL LOW (ref 12.0–15.0)
MCH: 29.1 pg (ref 26.0–34.0)
MCHC: 34.1 g/dL (ref 30.0–36.0)
MCV: 85.3 fL (ref 78.0–100.0)
PLATELETS: 271 10*3/uL (ref 150–400)
RBC: 3.88 MIL/uL (ref 3.87–5.11)
RDW: 13.4 % (ref 11.5–15.5)
WBC: 12.1 10*3/uL — AB (ref 4.0–10.5)

## 2014-11-03 MED ORDER — OXYTOCIN 40 UNITS IN LACTATED RINGERS INFUSION - SIMPLE MED: 62.5000 mL/h | INTRAVENOUS | Status: DC

## 2014-11-03 MED ORDER — PENICILLIN G POTASSIUM 5000000 UNITS IJ SOLR
2.5000 10*6.[IU] | INTRAVENOUS | Status: DC
  Administered 2014-11-04: 2.5 10*6.[IU] via INTRAVENOUS
  Filled 2014-11-03 (×4): qty 2.5

## 2014-11-03 MED ORDER — LACTATED RINGERS IV SOLN
500.0000 mL | Freq: Once | INTRAVENOUS | Status: AC
  Administered 2014-11-04: 500 mL via INTRAVENOUS

## 2014-11-03 MED ORDER — OXYCODONE-ACETAMINOPHEN 5-325 MG PO TABS: 1.0000 | ORAL_TABLET | ORAL | Status: DC | PRN

## 2014-11-03 MED ORDER — DEXTROSE 5 % IV SOLN
5.0000 10*6.[IU] | Freq: Once | INTRAVENOUS | Status: AC
  Administered 2014-11-03: 5 10*6.[IU] via INTRAVENOUS
  Filled 2014-11-03: qty 5

## 2014-11-03 MED ORDER — OXYTOCIN 40 UNITS IN LACTATED RINGERS INFUSION - SIMPLE MED
1.0000 m[IU]/min | INTRAVENOUS | Status: DC
  Administered 2014-11-03: 2 m[IU]/min via INTRAVENOUS
  Filled 2014-11-03: qty 1000

## 2014-11-03 MED ORDER — ONDANSETRON HCL 4 MG/2ML IJ SOLN: 4.0000 mg | Freq: Four times a day (QID) | INTRAMUSCULAR | Status: DC | PRN

## 2014-11-03 MED ORDER — EPHEDRINE 5 MG/ML INJ
10.0000 mg | INTRAVENOUS | Status: DC | PRN
  Filled 2014-11-03: qty 2

## 2014-11-03 MED ORDER — CITRIC ACID-SODIUM CITRATE 334-500 MG/5ML PO SOLN: 30.0000 mL | ORAL | Status: DC | PRN

## 2014-11-03 MED ORDER — TERBUTALINE SULFATE 1 MG/ML IJ SOLN: 0.2500 mg | Freq: Once | INTRAMUSCULAR | Status: AC | PRN

## 2014-11-03 MED ORDER — PHENYLEPHRINE 40 MCG/ML (10ML) SYRINGE FOR IV PUSH (FOR BLOOD PRESSURE SUPPORT)
80.0000 ug | PREFILLED_SYRINGE | INTRAVENOUS | Status: DC | PRN
  Administered 2014-11-04: 80 ug via INTRAVENOUS
  Filled 2014-11-03: qty 2

## 2014-11-03 MED ORDER — FENTANYL 2.5 MCG/ML BUPIVACAINE 1/10 % EPIDURAL INFUSION (WH - ANES)
14.0000 mL/h | INTRAMUSCULAR | Status: DC | PRN
  Administered 2014-11-04: 14 mL/h via EPIDURAL
  Filled 2014-11-03: qty 125

## 2014-11-03 MED ORDER — DIPHENHYDRAMINE HCL 50 MG/ML IJ SOLN: 12.5000 mg | INTRAMUSCULAR | Status: DC | PRN

## 2014-11-03 MED ORDER — LIDOCAINE HCL (PF) 1 % IJ SOLN
30.0000 mL | INTRAMUSCULAR | Status: DC | PRN
  Filled 2014-11-03: qty 30

## 2014-11-03 MED ORDER — ACETAMINOPHEN 325 MG PO TABS: 650.0000 mg | ORAL_TABLET | ORAL | Status: DC | PRN

## 2014-11-03 MED ORDER — PHENYLEPHRINE 40 MCG/ML (10ML) SYRINGE FOR IV PUSH (FOR BLOOD PRESSURE SUPPORT)
80.0000 ug | PREFILLED_SYRINGE | INTRAVENOUS | Status: DC | PRN
  Filled 2014-11-03: qty 2
  Filled 2014-11-03: qty 20

## 2014-11-03 MED ORDER — LACTATED RINGERS IV SOLN: 500.0000 mL | INTRAVENOUS | Status: DC | PRN

## 2014-11-03 MED ORDER — LACTATED RINGERS IV SOLN
INTRAVENOUS | Status: DC
  Administered 2014-11-03 – 2014-11-04 (×2): via INTRAVENOUS

## 2014-11-03 MED ORDER — OXYCODONE-ACETAMINOPHEN 5-325 MG PO TABS: 2.0000 | ORAL_TABLET | ORAL | Status: DC | PRN

## 2014-11-03 MED ORDER — OXYTOCIN BOLUS FROM INFUSION
500.0000 mL | INTRAVENOUS | Status: DC
  Administered 2014-11-04: 500 mL via INTRAVENOUS

## 2014-11-03 NOTE — H&P (Signed)
Loretta Howe is a 39 y.o. female G6P5005 at 38 weeks(EDD 11/11/14 by 8 week [place]) presenting for IOL.  Prenatal care complicated by polyhydramnios which resolved and fetal renal pyelectasis which also resolved on f/u scans.  She had a h/o GDM and IUGR in prior pregnancies, but no issues with this pregnancy.  She is rubella non-immune and GBS positive.  Maternal Medical History:  Contractions: Frequency: irregular.   Perceived severity is mild.    Fetal activity: Perceived fetal activity is normal.    Prenatal Complications - Diabetes: none.    OB History    Gravida Para Term Preterm AB TAB SAB Ectopic Multiple Living   6 5 5       5     NSVD x 5 (rangig from 5#4oz to 8#12oz)  Past Medical History  Diagnosis Date  . Diabetes mellitus   . History of mastitis   . Gestational diabetes     diet controlled   Past Surgical History  Procedure Laterality Date  . Wisdom tooth extraction     Family History: family history includes Alcohol abuse in her father; Cancer in her father and maternal grandmother; Depression in her father and mother. Social History:  reports that she has never smoked. She has never used smokeless tobacco. She reports that she does not drink alcohol or use illicit drugs.   Prenatal Transfer Tool  Maternal Diabetes: No Genetic Screening: Normal Maternal Ultrasounds/Referrals: Normal (mild renal pyelectasis resolved on f/u US) Fetal Ultrasounds or other Referrals:  None Maternal Substance Abuse:  No Significant Maternal Medications:  None Significant Maternal Lab Results:  Lab values include: Group B Strep positive Other Comments:  None  ROS    Last menstrual period 01/28/2014, unknown if currently breastfeeding. Maternal Exam:  Uterine Assessment: Contraction strength is moderate.  Contraction frequency is irregular.   Abdomen: Patient reports no abdominal tenderness. Fetal presentation: vertex  Introitus: Normal vulva. Normal vagina.    Physical Exam   Constitutional: She appears well-developed and well-nourished.  Cardiovascular: Normal rate and regular rhythm.   Respiratory: Effort normal and breath sounds normal.  GI: Soft.  Genitourinary: Vagina normal and uterus normal.  Neurological: She is alert.  Psychiatric: She has a normal mood and affect.    Prenatal labs: ABO, Rh: O/Positive/-- (07/16 0000) Antibody: Negative (07/16 0000) Rubella: Nonimmune (07/16 0000) RPR: Nonreactive (07/16 0000)  HBsAg: Negative (07/16 0000)  HIV: Non-reactive (07/16 0000)  GBS: Positive (01/15 0000)  First trimester screen and AFP negative CF negative One hour GTT 91 and 103 (at 15 weeks and 28 weeks)  Assessment/Plan: Pt for IOL at term.  Will get PCN on board for +GBS and then begin induction with pitocin and AROM.  Logan Bores 11/03/2014, 10:18 PM

## 2014-11-03 NOTE — Telephone Encounter (Signed)
Preadmission screen  

## 2014-11-04 ENCOUNTER — Inpatient Hospital Stay (HOSPITAL_COMMUNITY): Payer: 59 | Admitting: Anesthesiology

## 2014-11-04 ENCOUNTER — Inpatient Hospital Stay (HOSPITAL_COMMUNITY)
Admission: RE | Admit: 2014-11-04 | Discharge: 2014-11-04 | Disposition: A | Discharge status: Home / Self Care | Payer: 59 | Source: Ambulatory Visit | Attending: Obstetrics and Gynecology | Admitting: Obstetrics and Gynecology

## 2014-11-04 ENCOUNTER — Encounter (HOSPITAL_COMMUNITY): Payer: Self-pay | Admitting: Anesthesiology

## 2014-11-04 LAB — TYPE AND SCREEN
ABO/RH(D): O POS
ANTIBODY SCREEN: NEGATIVE

## 2014-11-04 LAB — CBC
HCT: 32.2 % — ABNORMAL LOW (ref 36.0–46.0)
Hemoglobin: 10.9 g/dL — ABNORMAL LOW (ref 12.0–15.0)
MCH: 28.8 pg (ref 26.0–34.0)
MCHC: 33.9 g/dL (ref 30.0–36.0)
MCV: 85 fL (ref 78.0–100.0)
Platelets: 271 10*3/uL (ref 150–400)
RBC: 3.79 MIL/uL — ABNORMAL LOW (ref 3.87–5.11)
RDW: 13.3 % (ref 11.5–15.5)
WBC: 18.8 10*3/uL — ABNORMAL HIGH (ref 4.0–10.5)

## 2014-11-04 MED ORDER — DIBUCAINE 1 % RE OINT: 1.0000 "application " | TOPICAL_OINTMENT | RECTAL | Status: DC | PRN

## 2014-11-04 MED ORDER — ONDANSETRON HCL 4 MG/2ML IJ SOLN: 4.0000 mg | INTRAMUSCULAR | Status: DC | PRN

## 2014-11-04 MED ORDER — DIPHENHYDRAMINE HCL 25 MG PO CAPS: 25.0000 mg | ORAL_CAPSULE | Freq: Four times a day (QID) | ORAL | Status: DC | PRN

## 2014-11-04 MED ORDER — ZOLPIDEM TARTRATE 5 MG PO TABS: 5.0000 mg | ORAL_TABLET | Freq: Every evening | ORAL | Status: DC | PRN

## 2014-11-04 MED ORDER — SIMETHICONE 80 MG PO CHEW: 80.0000 mg | CHEWABLE_TABLET | ORAL | Status: DC | PRN

## 2014-11-04 MED ORDER — LIDOCAINE HCL (PF) 1 % IJ SOLN
INTRAMUSCULAR | Status: DC | PRN
  Administered 2014-11-04 (×2): 5 mL

## 2014-11-04 MED ORDER — BENZOCAINE-MENTHOL 20-0.5 % EX AERO
1.0000 "application " | INHALATION_SPRAY | CUTANEOUS | Status: DC | PRN
  Administered 2014-11-04: 1 via TOPICAL
  Filled 2014-11-04: qty 56

## 2014-11-04 MED ORDER — WITCH HAZEL-GLYCERIN EX PADS: 1.0000 "application " | MEDICATED_PAD | CUTANEOUS | Status: DC | PRN

## 2014-11-04 MED ORDER — TETANUS-DIPHTH-ACELL PERTUSSIS 5-2.5-18.5 LF-MCG/0.5 IM SUSP: 0.5000 mL | Freq: Once | INTRAMUSCULAR | Status: DC

## 2014-11-04 MED ORDER — SENNOSIDES-DOCUSATE SODIUM 8.6-50 MG PO TABS
2.0000 | ORAL_TABLET | ORAL | Status: DC
  Administered 2014-11-04: 2 via ORAL
  Filled 2014-11-04: qty 2

## 2014-11-04 MED ORDER — MEASLES, MUMPS & RUBELLA VAC ~~LOC~~ INJ
0.5000 mL | INJECTION | Freq: Once | SUBCUTANEOUS | Status: AC
  Administered 2014-11-05: 0.5 mL via SUBCUTANEOUS
  Filled 2014-11-04 (×2): qty 0.5

## 2014-11-04 MED ORDER — PRENATAL MULTIVITAMIN CH
1.0000 | ORAL_TABLET | Freq: Every day | ORAL | Status: DC
  Administered 2014-11-04: 1 via ORAL
  Filled 2014-11-04: qty 1

## 2014-11-04 MED ORDER — IBUPROFEN 600 MG PO TABS
600.0000 mg | ORAL_TABLET | Freq: Four times a day (QID) | ORAL | Status: DC
  Administered 2014-11-04 – 2014-11-05 (×5): 600 mg via ORAL
  Filled 2014-11-04 (×5): qty 1

## 2014-11-04 MED ORDER — OXYCODONE-ACETAMINOPHEN 5-325 MG PO TABS: 2.0000 | ORAL_TABLET | ORAL | Status: DC | PRN

## 2014-11-04 MED ORDER — OXYCODONE-ACETAMINOPHEN 5-325 MG PO TABS
1.0000 | ORAL_TABLET | ORAL | Status: DC | PRN
  Administered 2014-11-04 – 2014-11-05 (×5): 1 via ORAL
  Filled 2014-11-04 (×5): qty 1

## 2014-11-04 MED ORDER — ONDANSETRON HCL 4 MG PO TABS: 4.0000 mg | ORAL_TABLET | ORAL | Status: DC | PRN

## 2014-11-04 MED ORDER — FENTANYL 2.5 MCG/ML BUPIVACAINE 1/10 % EPIDURAL INFUSION (WH - ANES)
INTRAMUSCULAR | Status: DC | PRN
  Administered 2014-11-04: 16 mL/h via EPIDURAL

## 2014-11-04 MED ORDER — LANOLIN HYDROUS EX OINT: TOPICAL_OINTMENT | CUTANEOUS | Status: DC | PRN

## 2014-11-04 NOTE — Progress Notes (Signed)
Patient ID: Loretta Howe, female   DOB: Apr 26, 1976, 39 y.o.   MRN: [id number] DOD Doing fine.  Plan circumcision in office.

## 2014-11-04 NOTE — Anesthesia Preprocedure Evaluation (Signed)
Anesthesia Evaluation  Patient identified by MRN, date of birth, ID band Patient awake    Reviewed: Allergy & Precautions, Patient's Chart, lab work & pertinent test results  Airway Mallampati: II  TM Distance: >3 FB Neck ROM: Full    Dental no notable dental hx. (+) Teeth Intact   Pulmonary neg pulmonary ROS,  breath sounds clear to auscultation  Pulmonary exam normal       Cardiovascular negative cardio ROS  Rhythm:Regular Rate:Normal     Neuro/Psych negative neurological ROS  negative psych ROS   GI/Hepatic negative GI ROS, Neg liver ROS,   Endo/Other  diabetes  Renal/GU negative Renal ROS  negative genitourinary   Musculoskeletal negative musculoskeletal ROS (+)   Abdominal   Peds  Hematology  (+) anemia ,   Anesthesia Other Findings   Reproductive/Obstetrics                             Anesthesia Physical Anesthesia Plan  ASA: II  Anesthesia Plan: Epidural   Post-op Pain Management:    Induction:   Airway Management Planned: Natural Airway  Additional Equipment:   Intra-op Plan:   Post-operative Plan:   Informed Consent: I have reviewed the patients History and Physical, chart, labs and discussed the procedure including the risks, benefits and alternatives for the proposed anesthesia with the patient or authorized representative who has indicated his/her understanding and acceptance.     Plan Discussed with: Anesthesiologist  Anesthesia Plan Comments:         Anesthesia Quick Evaluation

## 2014-11-04 NOTE — Anesthesia Postprocedure Evaluation (Signed)
Anesthesia Post Note  Patient: Loretta Howe  Procedure(s) Performed: * No procedures listed *  Anesthesia type: Epidural  Patient location: Mother/Baby  Post pain: Pain level controlled  Post assessment: Post-op Vital signs reviewed  Last Vitals:  Filed Vitals:   11/04/14 0628  BP: 108/52  Pulse: 74  Temp: 37.1 C  Resp: 18    Post vital signs: Reviewed  Level of consciousness:alert  Complications: No apparent anesthesia complications

## 2014-11-04 NOTE — Anesthesia Procedure Notes (Signed)
Epidural Patient location during procedure: OB Start time: 11/04/2014 2:25 AM  Staffing Anesthesiologist: Royce Macadamia, Cyan Clippinger A. Performed by: anesthesiologist   Preanesthetic Checklist Completed: patient identified, site marked, surgical consent, pre-op evaluation, timeout performed, IV checked, risks and benefits discussed and monitors and equipment checked  Epidural Patient position: sitting Prep: site prepped and draped and DuraPrep Patient monitoring: continuous pulse ox and blood pressure Approach: midline Location: L4-L5 Injection technique: LOR air  Needle:  Needle type: Tuohy  Needle gauge: 17 G Needle length: 9 cm and 9 Needle insertion depth: 5 cm cm Catheter type: closed end flexible Catheter size: 19 Gauge Catheter at skin depth: 10 cm Test dose: negative and Other  Assessment Events: blood not aspirated, injection not painful, no injection resistance, negative IV test and no paresthesia  Additional Notes Patient identified. Risks and benefits discussed including failed block, incomplete  Pain control, post dural puncture headache, nerve damage, paralysis, blood pressure Changes, nausea, vomiting, reactions to medications-both toxic and allergic and post Partum back pain. All questions were answered. Patient expressed understanding and wished to proceed. Sterile technique was used throughout procedure. Epidural site was Dressed with sterile barrier dressing. No paresthesias, signs of intravascular injection Or signs of intrathecal spread were encountered.  Patient was more comfortable after the epidural was dosed. Please see RN's note for documentation of vital signs and FHR which are stable.

## 2014-11-04 NOTE — Lactation Note (Signed)
This note was copied from the chart of [place]. Lactation Consultation Note  Mom breast fed 5 other childern for over one year.  She denies the need for any assistance with lactation.  She has the lactation handouts. Asked mom to call for latch assessment every 8 hours.  Follow-up PRN.  Patient Name: Loretta Howe Today's Date: 11/04/2014     Maternal Data Formula Feeding for Exclusion: No Has patient been taught Hand Expression?: Yes Does the patient have breastfeeding experience prior to this delivery?: Yes  Feeding    LATCH Score/Interventions                      Lactation Tools Discussed/Used     Consult Status      Loretta Howe 11/04/2014, 1:08 PM

## 2014-11-05 LAB — RPR: RPR Ser Ql: NONREACTIVE

## 2014-11-05 MED ORDER — IBUPROFEN 600 MG PO TABS: 600.0000 mg | ORAL_TABLET | Freq: Four times a day (QID) | ORAL | Status: DC | PRN

## 2014-11-05 MED ORDER — OXYCODONE-ACETAMINOPHEN 5-325 MG PO TABS: 1.0000 | ORAL_TABLET | Freq: Four times a day (QID) | ORAL | Status: DC | PRN

## 2014-11-05 NOTE — Progress Notes (Signed)
Patient ID: Loretta Howe, female   DOB: 08-13-1976, 39 y.o.   MRN: [id number] #2 afebrile BP normal no complaints for d/c

## 2014-11-05 NOTE — Discharge Instructions (Signed)
booklet °

## 2014-11-05 NOTE — Discharge Summary (Signed)
Loretta Howe, Loretta Howe               ACCOUNT NO.:  [id number]  MEDICAL RECORD NO.:  [id number]  LOCATION:  9124                          FACILITY:  [facility]  PHYSICIAN:  Lucille Passy. Ulanda Edison, M.D. DATE OF BIRTH:  1975/11/27  DATE OF ADMISSION:  11/03/2014 DATE OF DISCHARGE:  11/05/2014                              DISCHARGE SUMMARY   HISTORY OF PRESENT ILLNESS:  This is a 39 year old white female, para 5- 0-0-5, gravida 6 at [redacted] weeks gestation.  [facility] November 11, 2014, by an 8 week ultrasound, presented for induction of labor.  Prenatal care was complicated by polyhydramnios, which resolved and fetal renal pyelectasis which also resolved.  She had a history of gestational diabetes mellitus, and intrauterine growth restriction in prior pregnancies but no issues with this pregnancy.  On admission to the hospital, the patient was placed on Pitocin and then received an epidural.  She progressed quickly to complete dilatation.  At 3:30 a.m., a healthy female was delivered vaginally.  Spontaneous delivery LOT Apgars 8 and 9.  Delivery was done by Dr. Marvel Plan.  First-degree laceration was repaired with 3-0 Vicryl Rapide.  Postpartum, the patient did very well, and was discharged on the second postpartum day.  Hemoglobin on admission 11.3, hematocrit 33.1, white count 12,100, platelet count 270,000, followup hemoglobin was 10.9.  FINAL DIAGNOSIS:  Intrauterine pregnancy at 39 weeks, delivered LOT.  OPERATION:  Spontaneous delivery LOT, repair of first-degree laceration.  FINAL CONDITION:  Improved.  INSTRUCTIONS:  Include our regular discharge instruction booklet as well as our after visit summary.  She is given a prescription for Percocet 5/325, 30 tablets, 1 every 6 hours as needed for pain and Motrin 600 mg 30 tablets, 1 every 6 hours as needed for pain.  She is to continue her prenatal vitamins.  Return to the office in 6 weeks to see Dr. Marvel Plan.  If she does not have prenatal vitamins,  either acquire over the counter prenatal vitamins or take ferrous sulfate 325 mg twice daily.     Lucille Passy. Ulanda Edison, M.D.     TFH/MEDQ  D:  11/05/2014  T:  11/05/2014  Job:  [id number]

## 2016-06-28 ENCOUNTER — Ambulatory Visit: Payer: Self-pay | Admitting: Family Medicine

## 2016-07-04 ENCOUNTER — Ambulatory Visit (INDEPENDENT_AMBULATORY_CARE_PROVIDER_SITE_OTHER): Payer: Self-pay | Admitting: Family Medicine

## 2016-07-04 ENCOUNTER — Encounter: Payer: Self-pay | Admitting: Family Medicine

## 2016-07-04 VITALS — BP 100/68 | HR 76 | Temp 98.1°F | Ht 70.75 in | Wt 158.0 lb

## 2016-07-04 DIAGNOSIS — R222 Localized swelling, mass and lump, trunk: Secondary | ICD-10-CM

## 2016-07-04 DIAGNOSIS — Z23 Encounter for immunization: Secondary | ICD-10-CM

## 2016-07-04 DIAGNOSIS — Z7689 Persons encountering health services in other specified circumstances: Secondary | ICD-10-CM

## 2016-07-04 NOTE — Patient Instructions (Signed)
BEFORE YOU LEAVE: -follow up: yearly for physical and as needed -form  -We placed a referral for you as discussed to the surgeon for removal of the lump. It usually takes about 1-2 weeks to process and schedule this referral. If you have not heard from [place] regarding this appointment in 2 weeks please contact our office.

## 2016-07-04 NOTE — Progress Notes (Signed)
HPI:  Loretta Howe is here to establish care.  Last PCP and physical: sees Paula Compton yearly for physical - no labs.  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 She works out regularly at [employer] and eats a [employer]. She is utd on gyn physicals and vaccines.  ROS negative for unless reported above: fevers, unintentional weight loss, hearing or vision loss, chest pain, palpitations, struggling to breath, hemoptysis, melena, hematochezia, hematuria, falls, loc, si, thoughts of self harm  Past Medical History:  Diagnosis Date  . Gestational diabetes    diet controlled  . History of mastitis     Past Surgical History:  Procedure Laterality Date  . WISDOM TOOTH EXTRACTION      Family History  Problem Relation Age of Onset  . Depression Mother   . Alcohol abuse Father   . Depression Father   . Cancer Father     prostate  . Cancer Maternal Grandmother     bladder    Social History   Social History  . Marital status: Married    Spouse name: N/A  . Number of children: N/A  . Years of education: N/A   Social History Main Topics  . Smoking status: Never Smoker  . Smokeless tobacco: Never Used  . Alcohol use No  . Drug use: No  . Sexual activity: Yes   Other Topics Concern  . None   Social History Narrative   Work or School: stay at home      Home Situation: lives with spouse and 6 children      Spiritual Beliefs: Christian      Lifestyle: regular exercise; diet great       No current outpatient prescriptions on file.  EXAM:  Vitals:   07/04/16 1308  BP: 100/68  Pulse: 76  Temp: 98.1 F (36.7 C)    Body mass index is 22.19 kg/m.  GENERAL: vitals reviewed and listed above,  alert, oriented, appears well hydrated and in no acute distress  HEENT: atraumatic, conjunttiva clear, no obvious abnormalities on inspection of external nose and ears  NECK: no obvious masses on inspection  LUNGS: clear to auscultation bilaterally, no wheezes, rales or rhonchi, good air movement  CV: HRRR, no peripheral edema  MS: moves all extremities without noticeable abnormality; approx 6cm in diameter soft mass upper R back  PSYCH: pleasant and cooperative, no obvious depression or anxiety  ASSESSMENT AND PLAN:  Discussed the following assessment and plan:  Mass on back - Plan: Ambulatory referral to General Surgery -this is rather large  -opted for surgical referral for removal  Encounter to establish care with new doctor -We reviewed the PMH, PSH, FH, SH, Meds and Allergies. -We addressed current concerns per orders and patient instructions. -foster parent medical form completed - see scanned document -We have advised patient to follow up per instructions below.   -Patient advised to return or notify a doctor immediately if symptoms worsen or persist or new concerns arise.  Patient Instructions  BEFORE YOU LEAVE: -follow up: yearly for physical and as needed -form  -We placed a referral for you as discussed to the surgeon for removal of the lump. It usually takes about 1-2  weeks to process and schedule this referral. If you have not heard from [place] regarding this appointment in 2 weeks please contact our office.      Colin Benton R.

## 2016-07-04 NOTE — Progress Notes (Signed)
Pre visit review using our clinic review tool, if applicable. No additional management support is needed unless otherwise documented below in the visit note. 

## 2016-07-10 ENCOUNTER — Ambulatory Visit: Payer: Self-pay | Admitting: Surgery

## 2016-07-10 NOTE — H&P (Signed)
Loretta Howe 07/10/2016 4:01 PM Location: [facility] Patient #: [id number] DOB: 24-Oct-1975 Married / Language: English / Race: White Female  History of Present Illness Marcello Moores A. Dino Borntreger MD; 07/10/2016 5:06 PM) Patient words: Patient sent at the request of Dr. Maudie Mercury for a mass on her upper back. This been present for a number of months but is beginning a larger and beginning to bother the patient. She has discomfort when she lays on it. Denies any redness, fever, chills, or drainage from this area in her upper central back. It has been present for many months more than likely it is getting larger.  The patient is a 40 year old female.   Other Problems Elbert Ewings, CMA; 07/10/2016 4:01 PM) No pertinent past medical history  Past Surgical History Elbert Ewings, [place]; 07/10/2016 4:01 PM) Oral Surgery  Diagnostic Studies History Elbert Ewings, [place]; 07/10/2016 4:01 PM) Colonoscopy never Mammogram never Pap Smear 1-5 years ago  Allergies Elbert Ewings, CMA; 07/10/2016 4:01 PM) No Known Drug Allergies 07/10/2016  Medication History Elbert Ewings, CMA; 07/10/2016 4:01 PM) No Current Medications Medications Reconciled  Social History Elbert Ewings, CMA; 07/10/2016 4:01 PM) Alcohol use Occasional alcohol use. Caffeine use Coffee. No drug use Tobacco use Never smoker.  Family History Elbert Ewings, [place]; 07/10/2016 4:01 PM) Alcohol Abuse Father. Arthritis Father, Mother. Depression Father, Mother. Hypertension Mother. Prostate Cancer Father.  Pregnancy / Birth History Elbert Ewings, CMA; 07/10/2016 4:01 PM) Age at menarche 66 years. Contraceptive History Oral contraceptives. Gravida 6 Length (months) of breastfeeding 12-24 Maternal age 17-25 Para 6 Regular periods     Review of Systems Elbert Ewings CMA; 07/10/2016 4:01 PM) General Not Present- Appetite Loss, Chills, Fatigue, Fever, Night Sweats, Weight Gain and Weight Loss. Skin Not  Present- Change in Wart/Mole, Dryness, Hives, Jaundice, New Lesions, Non-Healing Wounds, Rash and Ulcer. HEENT Present- Wears glasses/contact lenses. Not Present- Earache, Hearing Loss, Hoarseness, Nose Bleed, Oral Ulcers, Ringing in the Ears, Seasonal Allergies, Sinus Pain, Sore Throat, Visual Disturbances and Yellow Eyes. Respiratory Not Present- Bloody sputum, Chronic Cough, Difficulty Breathing, Snoring and Wheezing. Breast Not Present- Breast Mass, Breast Pain, Nipple Discharge and Skin Changes. Cardiovascular Not Present- Chest Pain, Difficulty Breathing Lying Down, Leg Cramps, Palpitations, Rapid Heart Rate, Shortness of Breath and Swelling of Extremities. Gastrointestinal Not Present- Abdominal Pain, Bloating, Bloody Stool, Change in Bowel Habits, Chronic diarrhea, Constipation, Difficulty Swallowing, Excessive gas, Gets full quickly at meals, Hemorrhoids, Indigestion, Nausea, Rectal Pain and Vomiting. Female Genitourinary Not Present- Frequency, Nocturia, Painful Urination, Pelvic Pain and Urgency. Musculoskeletal Not Present- Back Pain, Joint Pain, Joint Stiffness, Muscle Pain, Muscle Weakness and Swelling of Extremities. Neurological Not Present- Decreased Memory, Fainting, Headaches, Numbness, Seizures, Tingling, Tremor, Trouble walking and Weakness. Psychiatric Not Present- Anxiety, Bipolar, Change in Sleep Pattern, Depression, Fearful and Frequent crying. Endocrine Not Present- Cold Intolerance, Excessive Hunger, Hair Changes, Heat Intolerance, Hot flashes and New Diabetes. Hematology Not Present- Blood Thinners, Easy Bruising, Excessive bleeding, Gland problems, HIV and Persistent Infections.  Vitals Elbert Ewings CMA; 07/10/2016 4:02 PM) 07/10/2016 4:01 PM Weight: 161 lb Height: 72in Body Surface Area: 1.94 m Body Mass Index: 21.84 kg/m  Temp.: 98.24F(Temporal)  Pulse: 74 (Regular)  Resp.: 16 (Unlabored)  BP: 122/78 (Sitting, Left Arm, Standard)      Physical  Exam ([place] Kuang A. Vesna Kable MD; 07/10/2016 5:06 PM)  General Mental Status-Alert. General Appearance-Consistent with stated age. Hydration-Well hydrated. Voice-Normal.  Integumentary Note: 10 cm x 10 cm mobile fatty mass central upper back consistent with lipoma.  Chest and Lung Exam Chest and lung exam reveals -quiet, even and easy respiratory effort with no use of accessory muscles and on auscultation, normal breath sounds, no adventitious sounds and normal vocal resonance. Inspection Chest Wall - Normal. Back - normal.  Cardiovascular Cardiovascular examination reveals -normal heart sounds, regular rate and rhythm with no murmurs and normal pedal pulses bilaterally.  Neurologic Neurologic evaluation reveals -alert and oriented x 3 with no impairment of recent or remote memory. Mental Status-Normal.  Musculoskeletal Normal Exam - Left-Upper Extremity Strength Normal and Lower Extremity Strength Normal. Normal Exam - Right-Upper Extremity Strength Normal and Lower Extremity Strength Normal.    Assessment & Plan (Keagan Brislin A. Mallie Linnemann MD; 07/10/2016 5:07 PM)  LIPOMA OF BACK (D17.1) Impression: pt desires excision of lipoma  Risk of bleeding, infection, recurrence, seroma, wound complications, cosmetic deformity, nerve injury, blood vessel injury, blood clots, cardiovascular issues and any further treatments and/or procedures discussed today. The patient agrees to proceed.  Current Plans You are being scheduled for surgery - Our schedulers will call you.  You should hear from our office's scheduling department within 5 working days about the location, date, and time of surgery. We try to make accommodations for patient's preferences in scheduling surgery, but sometimes the OR schedule or the surgeon's schedule prevents [place] from making those accommodations.  If you have not heard from our office [phone]) in 5 working days, call the office and ask for your  surgeon's nurse.  If you have other questions about your diagnosis, plan, or surgery, call the office and ask for your surgeon's nurse.  The anatomy and the physiology was discussed. The pathophysiology and natural history of the disease was discussed. Options were discussed and recommendations were made. Technique, risks, benefits, & alternatives were discussed. Risks such as stroke, heart attack, bleeding, indection, death, and other risks discussed. Questions answered. The patient agrees to proceed. Pt Education - [facility] Free Text Education/Instructions: discussed with patient and provided information. The pathophysiology of skin & subcutaneous masses was discussed. Natural history risks without surgery were discussed. I recommended surgery to remove the mass. I explained the technique of removal with use of local anesthesia & possible need for more aggressive sedation/anesthesia for patient comfort.  Risks such as bleeding, infection, wound breakdown, heart attack, death, and other risks were discussed. I noted a good likelihood this will help address the problem. Possibility that this will not correct all symptoms was explained. Possibility of regrowth/recurrence of the mass was discussed. We will work to minimize complications. Questions were answered. The patient expresses understanding & wishes to proceed with surgery.

## 2016-08-07 ENCOUNTER — Encounter (HOSPITAL_BASED_OUTPATIENT_CLINIC_OR_DEPARTMENT_OTHER): Payer: Self-pay | Admitting: *Deleted

## 2016-08-07 NOTE — Progress Notes (Signed)
No need for pt to come for anesthesia consult prior to [place] scheduled for 08-15-16 per Dr. Smith Robert.  Here 06-27-16 for other side and tolerated MAC and Bier block well.

## 2016-08-15 ENCOUNTER — Ambulatory Visit (HOSPITAL_BASED_OUTPATIENT_CLINIC_OR_DEPARTMENT_OTHER): Payer: Self-pay | Admitting: Certified Registered"

## 2016-08-15 ENCOUNTER — Encounter (HOSPITAL_BASED_OUTPATIENT_CLINIC_OR_DEPARTMENT_OTHER): Payer: Self-pay | Admitting: Certified Registered"

## 2016-08-15 ENCOUNTER — Encounter (HOSPITAL_BASED_OUTPATIENT_CLINIC_OR_DEPARTMENT_OTHER)
Admission: RE | Disposition: A | Discharge status: Home / Self Care | Payer: Self-pay | Source: Ambulatory Visit | Attending: Surgery

## 2016-08-15 ENCOUNTER — Ambulatory Visit (HOSPITAL_BASED_OUTPATIENT_CLINIC_OR_DEPARTMENT_OTHER)
Admission: RE | Admit: 2016-08-15 | Discharge: 2016-08-15 | Disposition: A | Discharge status: Home / Self Care | Payer: Self-pay | Source: Ambulatory Visit | Attending: Surgery | Admitting: Surgery

## 2016-08-15 DIAGNOSIS — D171 Benign lipomatous neoplasm of skin and subcutaneous tissue of trunk: Secondary | ICD-10-CM | POA: Insufficient documentation

## 2016-08-15 HISTORY — PX: LIPOMA EXCISION: SHX5283

## 2016-08-15 SURGERY — EXCISION LIPOMA
Anesthesia: General | Site: Back

## 2016-08-15 MED ORDER — FENTANYL CITRATE (PF) 100 MCG/2ML IJ SOLN
INTRAMUSCULAR | Status: AC
  Filled 2016-08-15: qty 2

## 2016-08-15 MED ORDER — LACTATED RINGERS IV SOLN: INTRAVENOUS | Status: DC

## 2016-08-15 MED ORDER — LACTATED RINGERS IV SOLN
INTRAVENOUS | Status: DC
  Administered 2016-08-15: 15:00:00 via INTRAVENOUS
  Administered 2016-08-15: 10 mL/h via INTRAVENOUS

## 2016-08-15 MED ORDER — DEXAMETHASONE SODIUM PHOSPHATE 4 MG/ML IJ SOLN
INTRAMUSCULAR | Status: DC | PRN
  Administered 2016-08-15: 10 mg via INTRAVENOUS

## 2016-08-15 MED ORDER — LIDOCAINE 2% (20 MG/ML) 5 ML SYRINGE
INTRAMUSCULAR | Status: AC
  Filled 2016-08-15: qty 5

## 2016-08-15 MED ORDER — ONDANSETRON HCL 4 MG/2ML IJ SOLN
INTRAMUSCULAR | Status: AC
  Filled 2016-08-15: qty 2

## 2016-08-15 MED ORDER — MEPERIDINE HCL 25 MG/ML IJ SOLN: 6.2500 mg | INTRAMUSCULAR | Status: DC | PRN

## 2016-08-15 MED ORDER — BUPIVACAINE HCL 0.25 % IJ SOLN
INTRAMUSCULAR | Status: DC | PRN
  Administered 2016-08-15: 10 mL

## 2016-08-15 MED ORDER — PROPOFOL 10 MG/ML IV BOLUS
INTRAVENOUS | Status: DC | PRN
  Administered 2016-08-15: 200 mg via INTRAVENOUS

## 2016-08-15 MED ORDER — MIDAZOLAM HCL 2 MG/2ML IJ SOLN
1.0000 mg | INTRAMUSCULAR | Status: DC | PRN
  Administered 2016-08-15: 2 mg via INTRAVENOUS

## 2016-08-15 MED ORDER — FENTANYL CITRATE (PF) 100 MCG/2ML IJ SOLN
50.0000 ug | INTRAMUSCULAR | Status: DC | PRN
  Administered 2016-08-15: 100 ug via INTRAVENOUS

## 2016-08-15 MED ORDER — CHLORHEXIDINE GLUCONATE CLOTH 2 % EX PADS: 6.0000 | MEDICATED_PAD | Freq: Once | CUTANEOUS | Status: DC

## 2016-08-15 MED ORDER — METOCLOPRAMIDE HCL 5 MG/ML IJ SOLN: 10.0000 mg | Freq: Once | INTRAMUSCULAR | Status: DC | PRN

## 2016-08-15 MED ORDER — DEXAMETHASONE SODIUM PHOSPHATE 10 MG/ML IJ SOLN
INTRAMUSCULAR | Status: AC
  Filled 2016-08-15: qty 1

## 2016-08-15 MED ORDER — ONDANSETRON HCL 4 MG/2ML IJ SOLN
INTRAMUSCULAR | Status: DC | PRN
  Administered 2016-08-15: 4 mg via INTRAVENOUS

## 2016-08-15 MED ORDER — FENTANYL CITRATE (PF) 100 MCG/2ML IJ SOLN: 25.0000 ug | INTRAMUSCULAR | Status: DC | PRN

## 2016-08-15 MED ORDER — DEXTROSE 5 % IV SOLN
3.0000 g | INTRAVENOUS | Status: AC
  Administered 2016-08-15: 2 g via INTRAVENOUS

## 2016-08-15 MED ORDER — MIDAZOLAM HCL 2 MG/2ML IJ SOLN
INTRAMUSCULAR | Status: AC
  Filled 2016-08-15: qty 2

## 2016-08-15 MED ORDER — CEFAZOLIN SODIUM-DEXTROSE 2-4 GM/100ML-% IV SOLN
INTRAVENOUS | Status: AC
  Filled 2016-08-15: qty 100

## 2016-08-15 MED ORDER — ARTIFICIAL TEARS OP OINT
TOPICAL_OINTMENT | OPHTHALMIC | Status: DC | PRN
  Administered 2016-08-15: 1 via OPHTHALMIC

## 2016-08-15 MED ORDER — HYDROCODONE-ACETAMINOPHEN 5-325 MG PO TABS: 1.0000 | ORAL_TABLET | Freq: Four times a day (QID) | ORAL | 0 refills | Status: DC | PRN

## 2016-08-15 MED ORDER — SUCCINYLCHOLINE CHLORIDE 20 MG/ML IJ SOLN
INTRAMUSCULAR | Status: DC | PRN
  Administered 2016-08-15: 120 mg via INTRAVENOUS

## 2016-08-15 MED ORDER — SCOPOLAMINE 1 MG/3DAYS TD PT72: 1.0000 | MEDICATED_PATCH | Freq: Once | TRANSDERMAL | Status: DC | PRN

## 2016-08-15 MED ORDER — ARTIFICIAL TEARS OP OINT
TOPICAL_OINTMENT | OPHTHALMIC | Status: AC
  Filled 2016-08-15: qty 3.5

## 2016-08-15 MED ORDER — LIDOCAINE HCL (CARDIAC) 20 MG/ML IV SOLN
INTRAVENOUS | Status: DC | PRN
  Administered 2016-08-15: 80 mg via INTRAVENOUS

## 2016-08-15 SURGICAL SUPPLY — 37 items
BENZOIN TINCTURE PRP APPL 2/3 (GAUZE/BANDAGES/DRESSINGS) IMPLANT
BLADE SURG 10 STRL SS (BLADE) IMPLANT
BLADE SURG 15 STRL LF DISP TIS (BLADE) ×1 IMPLANT
BLADE SURG 15 STRL SS (BLADE) ×1
CANISTER SUCT 1200ML W/VALVE (MISCELLANEOUS) IMPLANT
CHLORAPREP W/TINT 26ML (MISCELLANEOUS) ×2 IMPLANT
COVER BACK TABLE 60X90IN (DRAPES) ×2 IMPLANT
COVER MAYO STAND STRL (DRAPES) ×2 IMPLANT
DECANTER SPIKE VIAL GLASS SM (MISCELLANEOUS) IMPLANT
DERMABOND ADVANCED (GAUZE/BANDAGES/DRESSINGS)
DERMABOND ADVANCED .7 DNX12 (GAUZE/BANDAGES/DRESSINGS) IMPLANT
DRAPE LAPAROTOMY 100X72 PEDS (DRAPES) ×2 IMPLANT
DRAPE UTILITY XL STRL (DRAPES) ×2 IMPLANT
ELECT COATED BLADE 2.86 ST (ELECTRODE) ×2 IMPLANT
ELECT REM PT RETURN 9FT ADLT (ELECTROSURGICAL) ×2
ELECTRODE REM PT RTRN 9FT ADLT (ELECTROSURGICAL) ×1 IMPLANT
GLOVE BIOGEL PI IND STRL 8 (GLOVE) ×1 IMPLANT
GLOVE BIOGEL PI INDICATOR 8 (GLOVE) ×1
GLOVE ECLIPSE 8.0 STRL XLNG CF (GLOVE) ×2 IMPLANT
GOWN STRL REUS W/ TWL LRG LVL3 (GOWN DISPOSABLE) ×2 IMPLANT
GOWN STRL REUS W/TWL LRG LVL3 (GOWN DISPOSABLE) ×2
NEEDLE HYPO 25X1 1.5 SAFETY (NEEDLE) ×2 IMPLANT
NS IRRIG 1000ML POUR BTL (IV SOLUTION) IMPLANT
PACK BASIN DAY SURGERY FS (CUSTOM PROCEDURE TRAY) ×2 IMPLANT
PENCIL BUTTON HOLSTER BLD 10FT (ELECTRODE) ×2 IMPLANT
SLEEVE SCD COMPRESS KNEE MED (MISCELLANEOUS) ×2 IMPLANT
SPONGE LAP 4X18 X RAY DECT (DISPOSABLE) IMPLANT
STAPLER VISISTAT 35W (STAPLE) IMPLANT
STRIP CLOSURE SKIN 1/2X4 (GAUZE/BANDAGES/DRESSINGS) IMPLANT
SUT MON AB 4-0 PC3 18 (SUTURE) ×2 IMPLANT
SUT VICRYL 3-0 CR8 SH (SUTURE) IMPLANT
SUT VICRYL AB 3 0 TIES (SUTURE) IMPLANT
SYR CONTROL 10ML LL (SYRINGE) ×2 IMPLANT
TOWEL OR 17X24 6PK STRL BLUE (TOWEL DISPOSABLE) ×4 IMPLANT
TOWEL OR NON WOVEN STRL DISP B (DISPOSABLE) ×2 IMPLANT
TUBE CONNECTING 20X1/4 (TUBING) IMPLANT
YANKAUER SUCT BULB TIP NO VENT (SUCTIONS) IMPLANT

## 2016-08-15 NOTE — Interval H&P Note (Signed)
History and Physical Interval Note:  08/15/2016 1:42 PM  Loretta Howe  has presented today for surgery, with the diagnosis of BACK LIPOMA 10CM X 10 CM  The various methods of treatment have been discussed with the patient and family. After consideration of risks, benefits and other options for treatment, the patient has consented to  Procedure(s): EXCISION OF BACK LIPOMA (N/A) as a surgical intervention .  The patient's history has been reviewed, patient examined, no change in status, stable for surgery.  I have reviewed the patient's chart and labs.  Questions were answered to the patient's satisfaction.     Arta Stump A.

## 2016-08-15 NOTE — H&P (Signed)
Pre-op/Pre-procedure Orders Open     [facility]  Erroll Luna, MD  General Surgery   Additional Documentation   Vitals:   LMP 06/18/2016 (Exact Date)   Encounter Info:   Billing Info,   History,   Allergies,   Detailed Report     All Notes   H&P by Erroll Luna, MD at 15:08 PM   Author: Erroll Luna, MD Author Type: Physician Filed: 28 5:09 PM  Note Status: Signed Cosign: Cosign Not Required Encounter Date: [id number] 5:08 PM  Editor: Erroll Luna, MD (Physician)    Anaissa D. Falter  Location: [facility] Patient #: [id number] DOB: 1975-10-03 Married / Language: English / Race: White Female  History of Present Illness Marcello Moores A. Giulliana Mcroberts MD;  Patient words: Patient sent at the request of Dr. Maudie Mercury for a mass on her upper back. This been present for a number of months but is beginning a larger and beginning to bother the patient. She has discomfort when she lays on it. Denies any redness, fever, chills, or drainage from this area in her upper central back. It has been present for many months more than likely it is getting larger.  The patient is a 40 year old female.   Other Problems Elbert Ewings, CMA; 17 4:01 PM) No pertinent past medical history  Past Surgical History Elbert Ewings, [place]; 17 4:01 PM) Oral Surgery  Diagnostic Studies History Elbert Ewings, [place]; [id number] 4:01 PM) Colonoscopy never Mammogram never Pap Smear 1-5 years ago  Allergies Elbert Ewings, CMA;  4:01 PM) No Known Drug Allergies 07/10/2016  Medication History Elbert Ewings, CMA; 4:01 PM) No Current Medications Medications Reconciled  Social History Elbert Ewings, CMA;  4:01 PM) Alcohol use Occasional alcohol use. Caffeine use Coffee. No drug use Tobacco use Never smoker.  Family History Elbert Ewings, CMA; 4:01 PM) Alcohol Abuse Father. Arthritis Father, Mother. Depression Father, Mother. Hypertension Mother. Prostate Cancer  Father.  Pregnancy / Birth History Elbert Ewings, CMA;  4:01 PM) Age at menarche 15 years. Contraceptive History Oral contraceptives. Gravida 6 Length (months) of breastfeeding 12-24 Maternal age 52-25 Para 6 Regular periods     Review of Systems Elbert Ewings CMA; 4:01 PM) General Not Present- Appetite Loss, Chills, Fatigue, Fever, Night Sweats, Weight Gain and Weight Loss. Skin Not Present- Change in Wart/Mole, Dryness, Hives, Jaundice, New Lesions, Non-Healing Wounds, Rash and Ulcer. HEENT Present- Wears glasses/contact lenses. Not Present- Earache, Hearing Loss, Hoarseness, Nose Bleed, Oral Ulcers, Ringing in the Ears, Seasonal Allergies, Sinus Pain, Sore Throat, Visual Disturbances and Yellow Eyes. Respiratory Not Present- Bloody sputum, Chronic Cough, Difficulty Breathing, Snoring and Wheezing. Breast Not Present- Breast Mass, Breast Pain, Nipple Discharge and Skin Changes. Cardiovascular Not Present- Chest Pain, Difficulty Breathing Lying Down, Leg Cramps, Palpitations, Rapid Heart Rate, Shortness of Breath and Swelling of Extremities. Gastrointestinal Not Present- Abdominal Pain, Bloating, Bloody Stool, Change in Bowel Habits, Chronic diarrhea, Constipation, Difficulty Swallowing, Excessive gas, Gets full quickly at meals, Hemorrhoids, Indigestion, Nausea, Rectal Pain and Vomiting. Female Genitourinary Not Present- Frequency, Nocturia, Painful Urination, Pelvic Pain and Urgency. Musculoskeletal Not Present- Back Pain, Joint Pain, Joint Stiffness, Muscle Pain, Muscle Weakness and Swelling of Extremities. Neurological Not Present- Decreased Memory, Fainting, Headaches, Numbness, Seizures, Tingling, Tremor, Trouble walking and Weakness. Psychiatric Not Present- Anxiety, Bipolar, Change in Sleep Pattern, Depression, Fearful and Frequent crying. Endocrine Not Present- Cold Intolerance, Excessive Hunger, Hair Changes, Heat Intolerance, Hot flashes and New Diabetes. Hematology  Not Present- Blood Thinners, Easy Bruising, Excessive bleeding, Gland problems,  HIV and Persistent Infections.  Vitals Elbert Ewings CMA;  4:02 PM) 07/10/2016 4:01 PM Weight: 161 lb Height: 72in Body Surface Area: 1.94 m Body Mass Index: 21.84 kg/m  Temp.: 98.69F(Temporal)  Pulse: 74 (Regular)  Resp.: 16 (Unlabored)  BP: 122/78 (Sitting, Left Arm, Standard)      Physical Exam (Markise Haymer A. Devell Parkerson MD;  5:06 PM)  General Mental Status-Alert. General Appearance-Consistent with stated age. Hydration-Well hydrated. Voice-Normal.  Integumentary Note: 10 cm x 10 cm mobile fatty mass central upper back consistent with lipoma.  Chest and Lung Exam Chest and lung exam reveals -quiet, even and easy respiratory effort with no use of accessory muscles and on auscultation, normal breath sounds, no adventitious sounds and normal vocal resonance. Inspection Chest Wall - Normal. Back - normal.  Cardiovascular Cardiovascular examination reveals -normal heart sounds, regular rate and rhythm with no murmurs and normal pedal pulses bilaterally.  Neurologic Neurologic evaluation reveals -alert and oriented x 3 with no impairment of recent or remote memory. Mental Status-Normal.  Musculoskeletal Normal Exam - Left-Upper Extremity Strength Normal and Lower Extremity Strength Normal. Normal Exam - Right-Upper Extremity Strength Normal and Lower Extremity Strength Normal.    Assessment & Plan (Johari Bennetts A. Mackynzie Woolford MD;    LIPOMA OF BACK (D17.1) Impression: pt desires excision of lipoma  Risk of bleeding, infection, recurrence, seroma, wound complications, cosmetic deformity, nerve injury, blood vessel injury, blood clots, cardiovascular issues and any further treatments and/or procedures discussed today. The patient agrees to proceed.  Current Plans You are being scheduled for surgery - Our schedulers will call you.  You should hear from our  office's scheduling department within 5 working days about the location, date, and time of surgery. We try to make accommodations for patient's preferences in scheduling surgery, but sometimes the OR schedule or the surgeon's schedule prevents [place] from making those accommodations.  If you have not heard from our office [phone]) in 5 working days, call the office and ask for your surgeon's nurse.  If you have other questions about your diagnosis, plan, or surgery, call the office and ask for your surgeon's nurse.  The anatomy and the physiology was discussed. The pathophysiology and natural history of the disease was discussed. Options were discussed and recommendations were made. Technique, risks, benefits, & alternatives were discussed. Risks such as stroke, heart attack, bleeding, indection, death, and other risks discussed. Questions answered. The patient agrees to proceed. Pt Education - [facility] Free Text Education/Instructions: discussed with patient and provided information. The pathophysiology of skin & subcutaneous masses was discussed. Natural history risks without surgery were discussed. I recommended surgery to remove the mass. I explained the technique of removal with use of local anesthesia & possible need for more aggressive sedation/anesthesia for patient comfort.  Risks such as bleeding, infection, wound breakdown, heart attack, death, and other risks were discussed. I noted a good likelihood this will help address the problem. Possibility that this will not correct all symptoms was explained. Possibility of regrowth/recurrence of the mass was discussed. We will work to minimize complications. Questions were answered. The patient expresses understanding & wishes to proceed with surgery.

## 2016-08-15 NOTE — Anesthesia Postprocedure Evaluation (Signed)
Anesthesia Post Note  Patient: Youth worker Dominic  Procedure(s) Performed: Procedure(s) (LRB): EXCISION OF BACK LIPOMA (N/A)  Patient location during evaluation: PACU Anesthesia Type: General Level of consciousness: awake and alert Pain management: pain level controlled Vital Signs Assessment: post-procedure vital signs reviewed and stable Respiratory status: spontaneous breathing, nonlabored ventilation, respiratory function stable and patient connected to nasal cannula oxygen Cardiovascular status: blood pressure returned to baseline and stable Postop Assessment: no signs of nausea or vomiting Anesthetic complications: no    Last Vitals:  Vitals:   08/15/16 1515 08/15/16 1530  BP: 107/70 108/86  Pulse: 72 74  Resp: 16 18  Temp:      Last Pain:  Vitals:   08/15/16 1530  TempSrc:   PainSc: 0-No pain                 Effie Berkshire

## 2016-08-15 NOTE — Discharge Instructions (Signed)
GENERAL SURGERY: POST OP INSTRUCTIONS ° °###################################################################### ° °EAT °Gradually transition to a high fiber diet with a fiber supplement over the next few weeks after discharge.  Start with a pureed / full liquid diet (see below) ° °WALK °Walk an hour a day.  Control your pain to do that.   ° °CONTROL PAIN °Control pain so that you can walk, sleep, tolerate sneezing/coughing, go up/down stairs. ° °HAVE A BOWEL MOVEMENT DAILY °Keep your bowels regular to avoid problems.  OK to try a laxative to override constipation.  OK to use an antidairrheal to slow down diarrhea.  Call if not better after 2 tries ° °CALL IF YOU HAVE PROBLEMS/CONCERNS °Call if you are still struggling despite following these instructions. °Call if you have concerns not answered by these instructions ° °###################################################################### ° ° ° °1. DIET: Follow a light bland diet the first 24 hours after arrival home, such as soup, liquids, crackers, etc.  Be sure to include lots of fluids daily.  Avoid fast food or heavy meals as your are more likely to get nauseated.   °2. Take your usually prescribed home medications unless otherwise directed. °3. PAIN CONTROL: °a. Pain is best controlled by a usual combination of three different methods TOGETHER: °i. Ice/Heat °ii. Over the counter pain medication °iii. Prescription pain medication °b. Most patients will experience some swelling and bruising around the incisions.  Ice packs or heating pads (30-60 minutes up to 6 times a day) will help. Use ice for the first few days to help decrease swelling and bruising, then switch to heat to help relax tight/sore spots and speed recovery.  Some people prefer to use ice alone, heat alone, alternating between ice & heat.  Experiment to what works for you.  Swelling and bruising can take several weeks to resolve.   °c. It is helpful to take an over-the-counter pain medication  regularly for the first few weeks.  Choose one of the following that works best for you: °i. Naproxen (Aleve, etc)  Two 220mg tabs twice a day °ii. Ibuprofen (Advil, etc) Three 200mg tabs four times a day (every meal & bedtime) °iii. Acetaminophen (Tylenol, etc) 500-650mg four times a day (every meal & bedtime) °d. A  prescription for pain medication (such as oxycodone, hydrocodone, etc) should be given to you upon discharge.  Take your pain medication as prescribed.  °i. If you are having problems/concerns with the prescription medicine (does not control pain, nausea, vomiting, rash, itching, etc), please call us [phone] to see if we need to switch you to a different pain medicine that will work better for you and/or control your side effect better. °ii. If you need a refill on your pain medication, please contact your pharmacy.  They will contact our office to request authorization. Prescriptions will not be filled after 5 pm or on week-ends. °4. Avoid getting constipated.  Between the surgery and the pain medications, it is common to experience some constipation.  Increasing fluid intake and taking a fiber supplement (such as Metamucil, Citrucel, FiberCon, MiraLax, etc) 1-2 times a day regularly will usually help prevent this problem from occurring.  A mild laxative (prune juice, Milk of Magnesia, MiraLax, etc) should be taken according to package directions if there are no bowel movements after 48 hours.   °5. Wash / shower every day.  You may shower over the dressings as they are waterproof.  Continue to shower over incision(s) after the dressing is off. °6. Remove your waterproof bandages   5 days after surgery.  You may leave the incision open to air.  You may have skin tapes (Steri Strips) covering the incision(s).  Leave them on until one week, then remove.  You may replace a dressing/Band-Aid to cover the incision for comfort if you wish.  ° ° ° ° °7. ACTIVITIES as tolerated:   °a. You may resume  regular (light) daily activities beginning the next day--such as daily self-care, walking, climbing stairs--gradually increasing activities as tolerated.  If you can walk 30 minutes without difficulty, it is safe to try more intense activity such as jogging, treadmill, bicycling, low-impact aerobics, swimming, etc. °b. Save the most intensive and strenuous activity for last such as sit-ups, heavy lifting, contact sports, etc  Refrain from any heavy lifting or straining until you are off narcotics for pain control.   °c. DO NOT PUSH THROUGH PAIN.  Let pain be your guide: If it hurts to do something, don't do it.  Pain is your body warning you to avoid that activity for another week until the pain goes down. °d. You may drive when you are no longer taking prescription pain medication, you can comfortably wear a seatbelt, and you can safely maneuver your car and apply brakes. °e. You may have sexual intercourse when it is comfortable.  °8. FOLLOW UP in our office °a. Please call [facility] at [phone] to set up an appointment to see your surgeon in the office for a follow-up appointment approximately 2-3 weeks after your surgery. °b. Make sure that you call for this appointment the day you arrive home to insure a convenient appointment time. °9. IF YOU HAVE DISABILITY OR FAMILY LEAVE FORMS, BRING THEM TO THE OFFICE FOR PROCESSING.  DO NOT GIVE THEM TO YOUR DOCTOR. ° ° °WHEN TO CALL US [phone]: °1. Poor pain control °2. Reactions / problems with new medications (rash/itching, nausea, etc)  °3. Fever over 101.5 F (38.5 C) °4. Worsening swelling or bruising °5. Continued bleeding from incision. °6. Increased pain, redness, or drainage from the incision °7. Difficulty breathing / swallowing ° ° The clinic staff is available to answer your questions during regular business hours (8:30am-5pm).  Please don’t hesitate to call and ask to speak to one of our nurses for clinical concerns.  ° If you have a medical emergency,  go to the nearest emergency room or call 911. ° A surgeon from [facility] is always on call at the hospitals ° ° °[facility] °[address], [address], [place], [place]  [address] ? °MAIN: [phone] ? TOLL FREE: [phone] ?  °FAX [phone] °[email or web address] ° ° °Post Anesthesia Home Care Instructions ° °Activity: °Get plenty of rest for the remainder of the day. A responsible adult should stay with you for 24 hours following the procedure.  °For the next 24 hours, DO NOT: °-Drive a car °-Operate machinery °-Drink alcoholic beverages °-Take any medication unless instructed by your physician °-Make any legal decisions or sign important papers. ° °Meals: °Start with liquid foods such as gelatin or soup. Progress to regular foods as tolerated. Avoid greasy, spicy, heavy foods. If nausea and/or vomiting occur, drink only clear liquids until the nausea and/or vomiting subsides. Call your physician if vomiting continues. ° °Special Instructions/Symptoms: °Your throat may feel dry or sore from the anesthesia or the breathing tube placed in your throat during surgery. If this causes discomfort, gargle with warm salt water. The discomfort should disappear within 24 hours. ° °  your physician if vomiting continues.  Special Instructions/Symptoms: Your throat may feel dry or sore from the anesthesia or the breathing tube placed in your throat during surgery. If this causes discomfort, gargle with warm salt water. The discomfort should disappear within 24 hours.  If you had a scopolamine patch placed behind your ear for the management of post- operative nausea and/or vomiting:  1. The medication in the patch is effective for 72 hours, after which it should be removed.  Wrap patch in a tissue and discard in the trash. Wash hands thoroughly with soap and water. 2. You may remove the patch earlier than 72 hours if you experience unpleasant side effects which may include dry mouth, dizziness or visual disturbances. 3. Avoid touching the patch. Wash your hands with soap and water after contact with the patch.

## 2016-08-15 NOTE — Anesthesia Preprocedure Evaluation (Signed)
Anesthesia Evaluation  Patient identified by MRN, date of birth, ID band Patient awake    Reviewed: Allergy & Precautions, NPO status , Patient's Chart, lab work & pertinent test results  Airway Mallampati: II  TM Distance: >3 FB Neck ROM: Full    Dental no notable dental hx.    Pulmonary neg pulmonary ROS,    Pulmonary exam normal breath sounds clear to auscultation       Cardiovascular negative cardio ROS Normal cardiovascular exam Rhythm:Regular Rate:Normal     Neuro/Psych negative neurological ROS  negative psych ROS   GI/Hepatic negative GI ROS, Neg liver ROS,   Endo/Other  negative endocrine ROS  Renal/GU negative Renal ROS  negative genitourinary   Musculoskeletal negative musculoskeletal ROS (+)   Abdominal   Peds negative pediatric ROS (+)  Hematology negative hematology ROS (+)   Anesthesia Other Findings   Reproductive/Obstetrics negative OB ROS                             Anesthesia Physical Anesthesia Plan  ASA: I  Anesthesia Plan: General   Post-op Pain Management:    Induction: Intravenous  Airway Management Planned: Oral ETT  Additional Equipment:   Intra-op Plan:   Post-operative Plan: Extubation in OR  Informed Consent: I have reviewed the patients History and Physical, chart, labs and discussed the procedure including the risks, benefits and alternatives for the proposed anesthesia with the patient or authorized representative who has indicated his/her understanding and acceptance.   Dental advisory given  Plan Discussed with: CRNA  Anesthesia Plan Comments:         Anesthesia Quick Evaluation  

## 2016-08-15 NOTE — Transfer of Care (Signed)
Immediate Anesthesia Transfer of Care Note  Patient: Loretta Howe  Procedure(s) Performed: Procedure(s) with comments: EXCISION OF BACK LIPOMA (N/A) - EXCISION OF BACK LIPOMA  Patient Location: PACU  Anesthesia Type:General  Level of Consciousness: awake, alert  and patient cooperative  Airway & Oxygen Therapy: Patient Spontanous Breathing and Patient connected to face mask oxygen  Post-op Assessment: Report given to RN, Post -op Vital signs reviewed and stable and Patient moving all extremities  Post vital signs: Reviewed and stable  Last Vitals:  Vitals:   08/15/16 1459 08/15/16 1500  BP:  102/73  Pulse: 78 82  Resp: 12 13  Temp:      Last Pain:  Vitals:   08/15/16 1323  TempSrc: Oral         Complications: No apparent anesthesia complications

## 2016-08-15 NOTE — Anesthesia Procedure Notes (Signed)
Procedure Name: Intubation Date/Time: 08/15/2016 2:16 PM Performed by: Baxter Flattery Pre-anesthesia Checklist: Patient identified, Emergency Drugs available, Suction available and Patient being monitored Patient Re-evaluated:Patient Re-evaluated prior to inductionOxygen Delivery Method: Circle system utilized Preoxygenation: Pre-oxygenation with 100% oxygen Intubation Type: IV induction Ventilation: Mask ventilation without difficulty Laryngoscope Size: Miller and 2 Grade View: Grade I Tube type: Oral Tube size: 7.0 mm Number of attempts: 1 Airway Equipment and Method: Stylet and Oral airway Placement Confirmation: ETT inserted through vocal cords under direct vision,  positive ETCO2 and breath sounds checked- equal and bilateral Secured at: 23 cm Tube secured with: Tape Dental Injury: Teeth and Oropharynx as per pre-operative assessment

## 2016-08-15 NOTE — Op Note (Signed)
Preoperative diagnosis: Upper back mass  Postoperative diagnosis:  5 cm submuscular upper back mass consistent with lipoma  Procedure: Excision of deep submuscular 5 cm upper back mass  Surgeon: Erroll Luna M.D.  Anesthesia: LMA with 0.25% Sensorcaine plain  EBL: Minimal  Specimen: Lipoma to pathology  Drains: None  Indications for procedure: The patient presents for excision of an upper back mass the present ingrowing for a number of months periods causing discomfort she desires excision. Examination L like a lipoma which was mobile. I discussed observation versus excision. Risk of bleeding, infection, nerve injury causing dysfunction, blood vessel injury, hematoma, need for other procedures and/or treatment discussed with the patient. Observation discussed. She agreed to proceed with excision.  Description of procedure: The patient was met in the holding area. The mass was marked with the assistance of the patient. Questions are answered. Manuela Schwartz taken back to the operating room and placed supine. LMA was initiated and she was placed on a beanbag rolled with her left side down. The mass was in the upper midline just to the right of the midline. This is prepped and draped in sterile fashion. Timeout was done. She received preoperative antibiotics. Local anesthetic was infiltrated around mass. Incision was made and dissection was carried down to the subcutaneous  fatty tissues. The trapezius muscle was identified on the right was  identified. This was submuscular. The fibers were split and the mass was identified as a lipoma. It was easily dissected out from the submuscular space. It was Passed off the  field. No evidence of injury to to underlying nerves or blood vessels. Hemostasis achieved. . The aponeurosis of the muscle was closed with interrupted 3-0 Vicryl. 3-0 Vicryl was used to close the subcuticular space. 4 Monocryl used to close the skin. The liquid adhesive applied. All final counts  were correct. Patient was then placed back supine extubated taken to recovery in satisfactory condition

## 2016-08-16 ENCOUNTER — Encounter (HOSPITAL_BASED_OUTPATIENT_CLINIC_OR_DEPARTMENT_OTHER): Payer: Self-pay | Admitting: Surgery

## 2017-09-15 LAB — HM PAP SMEAR

## 2017-10-24 ENCOUNTER — Encounter: Payer: Self-pay | Admitting: Family Medicine

## 2024-07-28 ENCOUNTER — Ambulatory Visit: Payer: Self-pay | Admitting: Family Medicine

## 2024-07-28 ENCOUNTER — Encounter: Payer: Self-pay | Admitting: Family Medicine

## 2024-07-28 VITALS — BP 118/80 | HR 81 | Temp 98.1°F | Ht 71.0 in | Wt 192.8 lb

## 2024-07-28 DIAGNOSIS — G43829 Menstrual migraine, not intractable, without status migrainosus: Secondary | ICD-10-CM

## 2024-07-28 MED ORDER — NARATRIPTAN HCL 1 MG PO TABS: 1.0000 mg | ORAL_TABLET | Freq: Once | ORAL | 3 refills | Status: AC | PRN

## 2024-07-28 MED ORDER — ESTRADIOL 1 MG PO TABS: 1.0000 mg | ORAL_TABLET | Freq: Every day | ORAL | 1 refills | Status: DC | PRN

## 2024-07-28 NOTE — Patient Instructions (Signed)
 Welcome to [facility]! It was a pleasure meeting you today.  As discussed, Please schedule a 6 month follow up visit today.  Amerge for migraine Motrin     PLEASE NOTE:  If you had any LAB tests please let us  know if you have not heard back within a few days. You may see your results on MyChart before we have a chance to review them but we will give you a call once they are reviewed by us . If we ordered any REFERRALS today, please let us  know if you have not heard from their office within the next week.  Let us  know through MyChart if you are needing REFILLS, or have your pharmacy send us  the request. You can also use MyChart to communicate with me or any office staff.  Please try these tips to maintain a healthy lifestyle:  Eat most of your calories during the day when you are active. Eliminate processed foods including packaged sweets (pies, cakes, cookies), reduce intake of potatoes, white bread, white pasta, and white rice. Look for whole grain options, oat flour or almond flour.  Each meal should contain half fruits/vegetables, one quarter protein, and one quarter carbs (no bigger than a computer mouse).  Cut down on sweet beverages. This includes juice, soda, and sweet tea. Also watch fruit intake, though this is a healthier sweet option, it still contains natural sugar! Limit to 3 servings daily.  Drink at least 1 glass of water with each meal and aim for at least 8 glasses per day  Exercise at least 150 minutes every week.

## 2024-07-28 NOTE — Progress Notes (Signed)
 New Patient Office Visit  Subjective:  Patient ID: Loretta Howe, female    DOB: 06/18/1976  Age: 48 y.o. MRN: [id number]  CC:  Chief Complaint  Patient presents with   New Patient (Initial Visit)  Exercises.  Gyn Nathanel Bunker  Discussed the use of AI scribe software for clinical note transcription with the patient, who gave verbal consent to proceed.  History of Present Illness Loretta Howe is a 48 year old female with a history of migraines who presents with worsening menstrual migraines.  She has experienced migraines since childhood, which have become more frequent and severe in the last two menstrual cycles. The migraines are cycle-related, lasting for three days, initially relieved by Imitrex but with recurring symptoms. They are debilitating, requiring her to stay in bed with no noise or light. She experiences nausea but no vomiting. Triggers include inadequate sleep, food, dehydration, and high-intensity interval training, which she has adjusted to avoid.  She was prescribed Imitrex 50 mg in April, which initially provided relief but is now less effective. She occasionally notices visual changes but has not experienced auras. She has noted changes in sleep, body temperature, and weight gain over the past 18 months.  Her past medical history includes gestational diabetes during pregnancies with her fourth and fifth children, and possibly with her sixth. Her last A1c was 5.3 in December of the previous year-being monitored by gyn. She has had a lipoma removed from her back.  Family history is significant for her father's prostate cancer, which metastasized to the bones, and her paternal grandmother's bladder cancer. Her mother has depression and a history of high cholesterol and blood pressure. Her father also struggled with alcohol and narcotics and had depression.  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 She  does not use drugs, alcohol, or tobacco, and exercises regularly, considering it her primary form of self-care.  No chest pain, shortness of breath, vomiting, diarrhea, ulcers, depression, or suicidal thoughts. Nausea with migraines but no vomiting.     Current Outpatient Medications:    estradiol (ESTRACE) 1 MG tablet, Take 1 tablet (1 mg total) by mouth daily as needed., Disp: 30 tablet, Rfl: 1   naratriptan (AMERGE) 1 MG TABS tablet, Take 1 tablet (1 mg total) by mouth once as needed for up to 1 dose. Take one (1) tablet at onset of headache; if returns or does not resolve, may repeat after 4 hours; do not exceed five (5) mg in 24 hours., Disp: 10 tablet, Rfl: 3  Past Medical History:  Diagnosis Date   Gestational diabetes    diet controlled   History of mastitis    Menstrual migraine     Past Surgical History:  Procedure Laterality Date   LIPOMA EXCISION N/A 08/15/2016   Procedure: EXCISION OF BACK LIPOMA;  Surgeon: Debby Shipper, MD;  Location: [facility];  Service: General;  Laterality: N/A;  EXCISION OF BACK LIPOMA   WISDOM TOOTH EXTRACTION      Family History  Problem Relation Age of Onset   Hyperlipidemia Mother    Hypertension Mother    Depression Mother    Alcohol abuse Father    Depression Father    Cancer Father 19 - 14       prostate   Cancer Paternal Grandmother 56 - 89       bladder   Cancer Paternal Grandfather        unk  Social History   Socioeconomic History   Marital status: Married    Spouse name: Not on file   Number of children: 6   Years of education: Not on file   Highest education level: Not on file  Occupational History   Not on file  Tobacco Use   Smoking status: Never   Smokeless tobacco: Never  Vaping Use   Vaping status: Never Used  Substance and Sexual Activity   Alcohol use: No   Drug use: No   Sexual activity: Yes  Other Topics Concern   Not on file  Social History Narrative   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      Home Situation: lives with spouse and 6 children - homeschools and 1 foster      Spiritual Beliefs: Christian      Lifestyle: regular exercise; diet great   Social Drivers of Corporate Investment Banker Strain: Not on file  Food Insecurity: Not on file  Transportation Needs: Not on file  Physical Activity: Not on file  Stress: Not on file  Social Connections: Not on file  Intimate Partner Violence: Not on file    ROS  ROS: Gen: no fever, chills  Skin: no rash, itching ENT: no ear pain, ear drainage, nasal congestion, rhinorrhea, sinus pressure, sore throat Eyes: no blurry vision, double vision Resp: no cough, wheeze,SOB CV: no CP, palpitations, LE edema,  GI: no heartburn, n/v/d/c, abd pain GU: no dysuria, urgency, frequency, hematuria MSK: no joint pain, myalgias, back pain Neuro: no dizziness, headache, weakness, vertigo Psych: no depression, anxiety, insomnia, SI   Objective:   Today's Vitals: BP 118/80   Pulse 81   Temp 98.1 F (36.7 C)   Ht 5' 11 (1.803 m)   Wt 192 lb 12.8 oz (87.5 kg)   LMP 06/11/2024 (Exact Date)   SpO2 98%   BMI 26.89 kg/m   Physical Exam  Gen: WDWN NAD HEENT: NCAT, conjunctiva not injected, sclera nonicteric NECK:  supple, no thyromegaly, no nodes, no carotid bruits CARDIAC: RRR, S1S2+, no murmur. DP 2+B LUNGS: CTAB. No wheezes ABDOMEN:  BS+, soft, NTND, No HSM, no masses EXT:  no edema MSK: no gross abnormalities.  NEURO: A&O x3.  CN II-XII intact.  PSYCH: normal mood. Good eye contact   Assessment & Plan:  Menstrual migraine without status migrainosus, not intractable -     Estradiol; Take 1 tablet (1 mg total) by mouth daily as needed.  Dispense: 30 tablet; Refill: 1 -     Naratriptan HCl; Take 1 tablet (1 mg total) by mouth once as needed for up to 1 dose. Take one (1) tablet at onset of headache; if returns or does not resolve, may repeat after 4 hours; do not exceed five (5) mg in 24 hours.  Dispense: 10 tablet;  Refill: 3    Assessment and Plan Assessment & Plan Menstrual migraine   Chronic menstrual migraines have worsened over the last two cycles, lasting three days with nausea but no vomiting. She is cycle-related and temporarily responds to Imitrex 50 mg but last 3 days. No auras are reported. Triggers include dehydration and intense workouts. Perimenopausal hormonal changes may contribute. Prescribe Amerge, a longer-acting triptan, to be taken daily as needed during the cycle. Start low-dose estrogen a couple of days before the predicted period and continue for a week to mitigate hormone drop-related migraines. Recommend taking ibuprofen  a couple of days before the period to manage prostaglandin levels and reduce migraine  severity. Allow use of Tylenol  and Motrin  for additional headache relief.  Perimenopausal symptoms   Symptoms suggest perimenopause, including irregular periods, heavier bleeding, sleep disturbances, and body temperature fluctuations. Hormone levels are not checked due to regular periods and current guidelines. Hormone treatment typically involves birth control pills, but low-dose estrogen is considered for migraine management. Discuss potential hormone treatments with a gynecologist if symptoms persist or worsen.      Follow-up: Return in about 6 months (around 01/26/2025) for annual physical.   Jenkins CHRISTELLA Carrel, MD

## 2024-08-20 ENCOUNTER — Other Ambulatory Visit: Payer: Self-pay | Admitting: Family Medicine

## 2024-08-20 DIAGNOSIS — G43829 Menstrual migraine, not intractable, without status migrainosus: Secondary | ICD-10-CM

## 2025-01-27 ENCOUNTER — Encounter: Admitting: Family Medicine
